# Patient Record
Sex: Male | Born: 1985 | Hispanic: Yes | Marital: Single | Smoking: Never smoker
Health system: Southern US, Community
[De-identification: ages and names within clinical notes are randomized; demographics above are authoritative.]

## PROBLEM LIST (undated history)

## (undated) DIAGNOSIS — Z789 Other specified health status: Secondary | ICD-10-CM

## (undated) DIAGNOSIS — K859 Acute pancreatitis without necrosis or infection, unspecified: Secondary | ICD-10-CM

## (undated) HISTORY — PX: NO PAST SURGERIES: SHX2092

## (undated) HISTORY — DX: Acute pancreatitis without necrosis or infection, unspecified: K85.90

---

## 2015-06-30 ENCOUNTER — Inpatient Hospital Stay
Admission: EM | Admit: 2015-06-30 | Discharge: 2015-07-01 | DRG: 440 | Disposition: A | Discharge status: Home / Self Care | Payer: Self-pay | Attending: Internal Medicine | Admitting: Internal Medicine

## 2015-06-30 ENCOUNTER — Inpatient Hospital Stay: Payer: Self-pay

## 2015-06-30 ENCOUNTER — Encounter: Payer: Self-pay | Admitting: Emergency Medicine

## 2015-06-30 DIAGNOSIS — K859 Acute pancreatitis without necrosis or infection, unspecified: Secondary | ICD-10-CM | POA: Diagnosis present

## 2015-06-30 DIAGNOSIS — R109 Unspecified abdominal pain: Secondary | ICD-10-CM | POA: Diagnosis present

## 2015-06-30 HISTORY — DX: Other specified health status: Z78.9

## 2015-06-30 LAB — URINALYSIS COMPLETE WITH MICROSCOPIC (ARMC ONLY)
BACTERIA UA: NONE SEEN
Bilirubin Urine: NEGATIVE
GLUCOSE, UA: NEGATIVE mg/dL
HGB URINE DIPSTICK: NEGATIVE
Ketones, ur: NEGATIVE mg/dL
LEUKOCYTES UA: NEGATIVE
NITRITE: NEGATIVE
PROTEIN: NEGATIVE mg/dL
SPECIFIC GRAVITY, URINE: 1.029 (ref 1.005–1.030)
SQUAMOUS EPITHELIAL / LPF: NONE SEEN
pH: 6 (ref 5.0–8.0)

## 2015-06-30 LAB — COMPREHENSIVE METABOLIC PANEL
ALBUMIN: 4.3 g/dL (ref 3.5–5.0)
ALT: 19 U/L (ref 17–63)
ANION GAP: 4 — AB (ref 5–15)
AST: 22 U/L (ref 15–41)
Alkaline Phosphatase: 38 U/L (ref 38–126)
BUN: 18 mg/dL (ref 6–20)
CHLORIDE: 105 mmol/L (ref 101–111)
CO2: 26 mmol/L (ref 22–32)
Calcium: 8.8 mg/dL — ABNORMAL LOW (ref 8.9–10.3)
Creatinine, Ser: 0.71 mg/dL (ref 0.61–1.24)
GFR calc Af Amer: >60 mL/min (ref >60)
GFR calc non Af Amer: >60 mL/min (ref >60)
GLUCOSE: 115 mg/dL — AB (ref 65–99)
POTASSIUM: 3.7 mmol/L (ref 3.5–5.1)
SODIUM: 135 mmol/L (ref 135–145)
Total Bilirubin: 1.4 mg/dL — ABNORMAL HIGH (ref 0.3–1.2)
Total Protein: 7.2 g/dL (ref 6.5–8.1)

## 2015-06-30 LAB — CBC
HEMATOCRIT: 43.4 % (ref 40.0–52.0)
HEMOGLOBIN: 15.1 g/dL (ref 13.0–18.0)
MCH: 31.3 pg (ref 26.0–34.0)
MCHC: 34.8 g/dL (ref 32.0–36.0)
MCV: 89.9 fL (ref 80.0–100.0)
Platelets: 197 10*3/uL (ref 150–440)
RBC: 4.82 MIL/uL (ref 4.40–5.90)
RDW: 12.8 % (ref 11.5–14.5)
WBC: 10.8 10*3/uL — ABNORMAL HIGH (ref 3.8–10.6)

## 2015-06-30 LAB — LIPASE, BLOOD: LIPASE: 363 U/L — AB (ref 22–51)

## 2015-06-30 MED ORDER — ONDANSETRON HCL 4 MG/2ML IJ SOLN
4.0000 mg | Freq: Once | INTRAMUSCULAR | Status: AC
  Administered 2015-06-30: 4 mg via INTRAVENOUS
  Filled 2015-06-30: qty 2

## 2015-06-30 MED ORDER — SODIUM CHLORIDE 0.9 % IV SOLN
INTRAVENOUS | Status: AC
  Administered 2015-06-30 – 2015-07-01 (×3): via INTRAVENOUS

## 2015-06-30 MED ORDER — SODIUM CHLORIDE 0.9 % IV BOLUS (SEPSIS)
1000.0000 mL | Freq: Once | INTRAVENOUS | Status: AC
  Administered 2015-06-30: 1000 mL via INTRAVENOUS

## 2015-06-30 MED ORDER — IOHEXOL 240 MG/ML SOLN
25.0000 mL | Freq: Once | INTRAMUSCULAR | Status: AC | PRN
  Administered 2015-06-30: 25 mL via ORAL

## 2015-06-30 MED ORDER — ONDANSETRON HCL 4 MG PO TABS
4.0000 mg | ORAL_TABLET | Freq: Four times a day (QID) | ORAL | Status: DC | PRN
Start: 2015-06-30 — End: 2015-07-01

## 2015-06-30 MED ORDER — ENOXAPARIN SODIUM 40 MG/0.4ML ~~LOC~~ SOLN
40.0000 mg | SUBCUTANEOUS | Status: DC
  Administered 2015-06-30 – 2015-07-01 (×2): 40 mg via SUBCUTANEOUS
  Filled 2015-06-30 (×2): qty 0.4

## 2015-06-30 MED ORDER — ONDANSETRON HCL 4 MG/2ML IJ SOLN: 4.0000 mg | Freq: Four times a day (QID) | INTRAMUSCULAR | Status: DC | PRN

## 2015-06-30 MED ORDER — MORPHINE SULFATE (PF) 4 MG/ML IV SOLN
4.0000 mg | INTRAVENOUS | Status: DC | PRN
  Administered 2015-06-30: 4 mg via INTRAVENOUS
  Filled 2015-06-30: qty 1

## 2015-06-30 MED ORDER — MORPHINE SULFATE (PF) 4 MG/ML IV SOLN
4.0000 mg | Freq: Once | INTRAVENOUS | Status: AC
  Administered 2015-06-30: 4 mg via INTRAVENOUS
  Filled 2015-06-30: qty 1

## 2015-06-30 NOTE — Progress Notes (Signed)
Initial Nutrition Assessment    INTERVENTION:   Meals and Snacks: diet progression as medically able   NUTRITION DIAGNOSIS:   Inadequate oral intake related to acute illness as evidenced by  (NPO/CL).  GOAL:   Patient will meet greater than or equal to 90% of their needs  MONITOR:    (Energy Intake, Anthropometrics, Digestive System)  REASON FOR ASSESSMENT:   Diagnosis     ASSESSMENT:    Pt admitted with acute pancreatitis, unclear etiology, workup in progress  Past Medical History  Diagnosis Date  . Patient denies medical problems      Diet Order:  Diet clear liquid Room service appropriate?: Yes; Fluid consistency:: Thin   Energy Intake: pt just admitted, diet just advanced  Food and Nutrition related history: appetite good prior to admission Electrolyte and Renal Profile:  Recent Labs Lab 06/30/15 0447  BUN 18  CREATININE 0.71  NA 135  K 3.7    Lipid Profile: no labs  Gastrointestinal Profile Lipase     Component Value Date/Time   LIPASE 363* 06/30/2015 0447    Meds: NS at 100 ml/hr  Height:   Ht Readings from Last 1 Encounters:  06/30/15  (1.676 m)    Weight: stable weight  Wt Readings from Last 1 Encounters:  06/30/15 137 lb 4.8 oz (62.279 kg)    BMI:  Body mass index is 22.17 kg/(m^2).  LOW Care Level  Romelle Starcher MS, [place], LDN [phone] Pager

## 2015-06-30 NOTE — ED Notes (Signed)
Pharmacy tech in room at this time. Spoke with Victorino Dike [phone]) on language line to discuss with patient admission to the floor. NAD noted at this time. Pt verbalized understanding via interpreter.

## 2015-06-30 NOTE — ED Notes (Signed)
Pt. States abdominal pain that started around 4 pm yesterday.  Pt. States pain has been intermittent.  Pt. Denies taking any OTC medications.  Pt. Denies same symptoms in the past.

## 2015-06-30 NOTE — Progress Notes (Signed)
Seen the pt, pain is little better- start on liquid diet.

## 2015-06-30 NOTE — ED Notes (Addendum)
Pt states had mid abd pain that started at 1600 yesterday. Pt states pain is worse if he takes a deep breath, moves. Pt denies nausea, vomiting, diarrhea. Pt states pain started approx 20 minutes after eating yesterday. Skin warm and dry, normal color. Information obtained via interpreter Rafeal.

## 2015-06-30 NOTE — H&P (Signed)
[facility] - [place] at [facility]   PATIENT NAME: Larry Farmer    MR#:  [id number]  DATE OF BIRTH:  07-23-1986  DATE OF ADMISSION:  06/30/2015  PRIMARY CARE PHYSICIAN: No PCP Per Patient   REQUESTING/REFERRING PHYSICIAN: Lenard Lance, MD  CHIEF COMPLAINT:   Chief Complaint  Patient presents with  . Abdominal Pain    HISTORY OF PRESENT ILLNESS:  Larry Farmer  is a 29 y.o. male who presents with acute epigastric abdominal pain. Patient has no prior known medical history, and takes no medications. He states that the pain began the day prior to admission around 4:00 the afternoon when he was drinking a couple of beers. Patient denies excessive alcohol use, stating that he only drinks one or 2 beers occasionally some weekends. He says that the pain was first epigastric, then became diffuse abdominal pain. Nothing seemed to make it better or worse, and a grandson severity until he decided to come to the ED for evaluation. He denies fever, chills. He endorses nausea without overt vomiting. He denies diarrhea. On evaluation in the ED was found to have an elevated bilirubin and elevated lipase. Hospitalists were called for admission for pancreatitis.  PAST MEDICAL HISTORY:   Past Medical History  Diagnosis Date  . Patient denies medical problems     PAST SURGICAL HISTORY:   Past Surgical History  Procedure Laterality Date  . No past surgeries      SOCIAL HISTORY:   Social History  Substance Use Topics  . Smoking status: Never Smoker   . Smokeless tobacco: Never Used  . Alcohol Use: 1.2 oz/week    2 Cans of beer per week    FAMILY HISTORY:   Family History  Problem Relation Age of Onset  . Family history unknown: Yes    DRUG ALLERGIES:  No Known Allergies  MEDICATIONS AT HOME:   Prior to Admission medications   Not on File    REVIEW OF SYSTEMS:  Review of Systems  Constitutional: Negative for fever, chills, weight loss and  malaise/fatigue.  HENT: Negative for ear pain, hearing loss and tinnitus.   Eyes: Negative for blurred vision, double vision, pain and redness.  Respiratory: Negative for cough, hemoptysis and shortness of breath.   Cardiovascular: Negative for chest pain, palpitations, orthopnea and leg swelling.  Gastrointestinal: Positive for nausea and abdominal pain. Negative for diarrhea and constipation.  Genitourinary: Negative for dysuria, frequency and hematuria.  Musculoskeletal: Negative for back pain, joint pain and neck pain.  Skin:       No acne, rash, or lesions  Neurological: Negative for dizziness, tremors, focal weakness and weakness.  Endo/Heme/Allergies: Negative for polydipsia. Does not bruise/bleed easily.  Psychiatric/Behavioral: Negative for depression. The patient is not nervous/anxious and does not have insomnia.      VITAL SIGNS:   Filed Vitals:   06/30/15 0452 06/30/15 0509  BP: 157/67 147/86  Pulse: 82 75  Temp: 98.5 F (36.9 C) 98.3 F (36.8 C)  TempSrc: Oral Oral  Resp: 16   Weight: 62.143 kg (137 lb)   SpO2: 100% 100%   Wt Readings from Last 3 Encounters:  06/30/15 62.143 kg (137 lb)    PHYSICAL EXAMINATION:  Physical Exam  Vitals reviewed. Constitutional: He is oriented to person, place, and time. He appears well-developed and well-nourished. No distress.  HENT:  Head: Normocephalic and atraumatic.  Mouth/Throat: Oropharynx is clear and moist.  Eyes: Conjunctivae and EOM are normal. Pupils are equal, round, and  reactive to light. Scleral icterus (very mild) is present.  Neck: Normal range of motion. Neck supple. No JVD present. No thyromegaly present.  Cardiovascular: Normal rate, regular rhythm and intact distal pulses.  Exam reveals no gallop and no friction rub.   No murmur heard. Respiratory: Effort normal and breath sounds normal. No respiratory distress. He has no wheezes. He has no rales.  GI: Soft. Bowel sounds are normal. He exhibits no  distension. There is tenderness (Left upper and lower quadrants).  Musculoskeletal: Normal range of motion. He exhibits no edema.  No arthritis, no gout  Lymphadenopathy:    He has no cervical adenopathy.  Neurological: He is alert and oriented to person, place, and time. No cranial nerve deficit.  No dysarthria, no aphasia  Skin: Skin is warm and dry. No rash noted. No erythema.  Psychiatric: He has a normal mood and affect. His behavior is normal. Judgment and thought content normal.    LABORATORY PANEL:   CBC  Recent Labs Lab 06/30/15 0447  WBC 10.8*  HGB 15.1  HCT 43.4  PLT 197   ------------------------------------------------------------------------------------------------------------------  Chemistries   Recent Labs Lab 06/30/15 0447  NA 135  K 3.7  CL 105  CO2 26  GLUCOSE 115*  BUN 18  CREATININE 0.71  CALCIUM 8.8*  AST 22  ALT 19  ALKPHOS 38  BILITOT 1.4*   ------------------------------------------------------------------------------------------------------------------  Cardiac Enzymes No results for input(s): TROPONINI in the last 168 hours. ------------------------------------------------------------------------------------------------------------------  RADIOLOGY:  No results found.  EKG:  No orders found for this or any previous visit.  IMPRESSION AND PLAN:  Principal Problem:   Pancreatitis - elevated lipase. Patient has some history of alcohol use, though he denies excessive use. We will keep him nothing by mouth for now, give him IV fluids, control his pain and nausea. He can begin eating again as tolerated once his pain is better controlled. Active Problems:   Abdominal pain - not classic for pancreatitis as it became diffuse, now seems to move to left upper and lower quadrants. We will get an ultrasound of the abdomen to evaluate for other potential pathologies.   Hyperbilirubinemia - somewhat elevated, potentially due to pancreatitis,  potentially due to some other pathology in his biliary tract, ultrasound abdomen as above   All the records are reviewed and case discussed with ED provider. Management plans discussed with the patient and/or family.  DVT PROPHYLAXIS: SubQ lovenox  ADMISSION STATUS: Inpatient  CODE STATUS: Full  TOTAL TIME TAKING CARE OF THIS PATIENT: 50 minutes.    Denzil Mceachron, Rohith FIELDING 06/30/2015, 7:13 AM  [facility]  Office  [phone]  CC: Primary care physician; No PCP Per Patient

## 2015-06-30 NOTE — ED Provider Notes (Signed)
[facility] Emergency Department Provider Note  Time seen: 5:19 AM  I have reviewed the triage vital signs and the nursing notes.   HISTORY  Chief Complaint Abdominal Pain    HPI Larry Farmer is a 29 y.o. male with no past medical history presents the emergency department for mid to left-sided abdominal pain. According to the patient around 4 PM yesterday he began experiencing mid abdominal pain which is migrated to the left side. States moderate pain much worse with movement especially bending movement. Denies any dysuria, hematuria, diarrhea, nausea, vomiting, fever. Describes his pain as moderate when lying flat, severe when moving.     History reviewed. No pertinent past medical history.  There are no active problems to display for this patient.   History reviewed. No pertinent past surgical history.  No current outpatient prescriptions on file.  Allergies Review of patient's allergies indicates no known allergies.  History reviewed. No pertinent family history.  Social History Social History  Substance Use Topics  . Smoking status: Never Smoker   . Smokeless tobacco: Never Used  . Alcohol Use: Yes    Review of Systems Constitutional: Negative for fever. Cardiovascular: Negative for chest pain. Respiratory: Negative for shortness of breath. Gastrointestinal: Positive for left-sided abdominal pain. Negative nausea, vomiting, diarrhea. Musculoskeletal: Negative for back pain. Neurological: Negative for headache 10-point ROS otherwise negative.  ____________________________________________   PHYSICAL EXAM:  VITAL SIGNS: ED Triage Vitals  Enc Vitals Group     BP 06/30/15 0452 157/67 mmHg     Pulse Rate 06/30/15 0452 82     Resp 06/30/15 0452 16     Temp 06/30/15 0452 98.5 F (36.9 C)     Temp Source 06/30/15 0452 Oral     SpO2 06/30/15 0452 100 %     Weight 06/30/15 0452 137 lb (62.143 kg)     Height --      Head Cir  --      Peak Flow --      Pain Score 06/30/15 0454 7     Pain Loc --      Pain Edu? --      Excl. in GC? --     Constitutional: Alert and oriented. Well appearing and in no distress. Eyes: Normal exam ENT   Head: Normocephalic and atraumatic. Cardiovascular: Normal rate, regular rhythm. No murmur Respiratory: Normal respiratory effort without tachypnea nor retractions. Breath sounds are clear and equal bilaterally. No wheezes/rales/rhonchi. Gastrointestinal: Soft, moderate left sided and mid abdominal tenderness palpation. No rebound or guarding. No distention. No CVA tenderness. Musculoskeletal: Nontender with normal range of motion in all extremities. Neurologic:  Normal speech and language. No gross focal neurologic deficits  Skin:  Skin is warm, dry and intact.  Psychiatric: Mood and affect are normal. Speech and behavior are normal. Patient exhibits appropriate insight and judgment.  ____________________________________________    INITIAL IMPRESSION / ASSESSMENT AND PLAN / ED COURSE  Pertinent labs & imaging results that were available during my care of the patient were reviewed by me and considered in my medical decision making (see chart for details).  Patient with moderate tenderness palpation of the mid left abdomen. We will check labs, and likely proceed with CT scan of the abdomen and pelvis given the degree of tenderness.  ----------------------------------------- 6:31 AM on 06/30/2015 -----------------------------------------  Labs are consistent with acute pancreatitis. Given this finding I do not believe the patient would benefit additionally from CT scan. Patient does drink alcohol. LFTs otherwise  within normal limits. We will start IV fluids, pain medication as needed, admit to the hospital for further treatment and evaluation.  ____________________________________________   FINAL CLINICAL IMPRESSION(S) / ED DIAGNOSES  Left-sided abdominal pain Acute  pancreatitis  Minna Antis, MD 06/30/15 [phone]

## 2015-07-01 LAB — CBC
HCT: 39.1 % — ABNORMAL LOW (ref 40.0–52.0)
Hemoglobin: 13.7 g/dL (ref 13.0–18.0)
MCH: 31.2 pg (ref 26.0–34.0)
MCHC: 34.9 g/dL (ref 32.0–36.0)
MCV: 89.2 fL (ref 80.0–100.0)
Platelets: 164 10*3/uL (ref 150–440)
RBC: 4.38 MIL/uL — ABNORMAL LOW (ref 4.40–5.90)
RDW: 13.1 % (ref 11.5–14.5)
WBC: 7.1 10*3/uL (ref 3.8–10.6)

## 2015-07-01 LAB — HEPATIC FUNCTION PANEL
ALK PHOS: 33 U/L — AB (ref 38–126)
ALT: 15 U/L — AB (ref 17–63)
AST: 16 U/L (ref 15–41)
Albumin: 3.6 g/dL (ref 3.5–5.0)
BILIRUBIN INDIRECT: 1.4 mg/dL — AB (ref 0.3–0.9)
BILIRUBIN TOTAL: 1.6 mg/dL — AB (ref 0.3–1.2)
Bilirubin, Direct: 0.2 mg/dL (ref 0.1–0.5)
Total Protein: 6.3 g/dL — ABNORMAL LOW (ref 6.5–8.1)

## 2015-07-01 LAB — BASIC METABOLIC PANEL
Anion gap: 5 (ref 5–15)
BUN: 8 mg/dL (ref 6–20)
CO2: 27 mmol/L (ref 22–32)
Calcium: 8.7 mg/dL — ABNORMAL LOW (ref 8.9–10.3)
Chloride: 107 mmol/L (ref 101–111)
Creatinine, Ser: 0.68 mg/dL (ref 0.61–1.24)
GFR calc Af Amer: >60 mL/min (ref >60)
GFR calc non Af Amer: >60 mL/min (ref >60)
Glucose, Bld: 98 mg/dL (ref 65–99)
Potassium: 3.4 mmol/L — ABNORMAL LOW (ref 3.5–5.1)
Sodium: 139 mmol/L (ref 135–145)

## 2015-07-01 MED ORDER — IBUPROFEN 200 MG PO TABS: 200.0000 mg | ORAL_TABLET | Freq: Four times a day (QID) | ORAL | Status: AC | PRN

## 2015-07-01 NOTE — Discharge Instructions (Signed)

## 2015-07-01 NOTE — Progress Notes (Signed)
Larry Farmer and O x 4. VSS. Larry tolerating diet well. Minimal complaints of pain with no meds needed. No complaints of nausea. IV removed intact, prescriptions given. Larry voiced understanding of discharge instructions with no further questions. Interpreter used to go over discharge instructions. Larry discharged via wheelchair with nurse.

## 2015-07-01 NOTE — Discharge Summary (Signed)
[facility] - [place] at [facility]   PATIENT NAME: Larry Farmer    MR#:  [id number]  DATE OF BIRTH:  Mar 22, 1986  DATE OF ADMISSION:  06/30/2015 ADMITTING PHYSICIAN: Oralia Manis, MD  DATE OF DISCHARGE: 07/01/2015  PRIMARY CARE PHYSICIAN: No PCP Per Patient    ADMISSION DIAGNOSIS:  Acute pancreatitis, unspecified pancreatitis type [K85.9]  DISCHARGE DIAGNOSIS:  Principal Problem:   Pancreatitis Active Problems:   Hyperbilirubinemia   Abdominal pain   SECONDARY DIAGNOSIS:   Past Medical History  Diagnosis Date  . Patient denies medical problems     HOSPITAL COURSE:    * Acute Pancreatitis - elevated lipase.  some history of alcohol use, though he denies excessive use.  improved with IV fluids, not much pain now, tolerated soft diet.  Discharge home.   * Abdominal pain - not classic for pancreatitis as it became diffuse, now seems to move to left upper and lower quadrants.   US abdomen ruled out other potential pathologies. Pain is much better now.  * Hyperbilirubinemia - somewhat elevated, potentially due to pancreatitis,   No stone o obstruction on [place] abd- and LFTs remained stable on follow up today.  DISCHARGE CONDITIONS:   stable  CONSULTS OBTAINED:     DRUG ALLERGIES:  No Known Allergies  DISCHARGE MEDICATIONS:   Current Discharge Medication List    START taking these medications   Details  ibuprofen (ADVIL) 200 MG tablet Take 1 tablet (200 mg total) by mouth every 6 (six) hours as needed. Qty: 15 tablet, Refills: 0         DISCHARGE INSTRUCTIONS:    Follow with PMD as routine.  If you experience worsening of your admission symptoms, develop shortness of breath, life threatening emergency, suicidal or homicidal thoughts you must seek medical attention immediately by calling 911 or calling your MD immediately  if symptoms less severe.  You Must read complete instructions/literature along with  all the possible adverse reactions/side effects for all the Medicines you take and that have been prescribed to you. Take any new Medicines after you have completely understood and accept all the possible adverse reactions/side effects.   Please note  You were cared for by a hospitalist during your hospital stay. If you have any questions about your discharge medications or the care you received while you were in the hospital after you are discharged, you can call the unit and asked to speak with the hospitalist on call if the hospitalist that took care of you is not available. Once you are discharged, your primary care physician will handle any further medical issues. Please note that NO REFILLS for any discharge medications will be authorized once you are discharged, as it is imperative that you return to your primary care physician (or establish a relationship with a primary care physician if you do not have one) for your aftercare needs so that they can reassess your need for medications and monitor your lab values.    Today   CHIEF COMPLAINT:   Chief Complaint  Patient presents with  . Abdominal Pain    HISTORY OF PRESENT ILLNESS:  Larry Farmer  is a 29 y.o. male who presents with acute epigastric abdominal pain. Patient has no prior known medical history, and takes no medications. He states that the pain began the day prior to admission around 4:00 the afternoon when he was drinking a couple of beers. Patient denies excessive alcohol use, stating that he only drinks one  or 2 beers occasionally some weekends. He says that the pain was first epigastric, then became diffuse abdominal pain. Nothing seemed to make it better or worse, and a grandson severity until he decided to come to the ED for evaluation. He denies fever, chills. He endorses nausea without overt vomiting. He denies diarrhea. On evaluation in the ED was found to have an elevated bilirubin and elevated lipase. Hospitalists  were called for admission for pancreatitis.   VITAL SIGNS:  Blood pressure 132/60, pulse 65, temperature 98.5 F (36.9 C), temperature source Oral, resp. rate 16, height  (1.676 m), weight 62.279 kg (137 lb 4.8 oz), SpO2 100 %.  I/O:   Intake/Output Summary (Last 24 hours) at 07/01/15 1449 Last data filed at 07/01/15 1300  Gross per 24 hour  Intake   2664 ml  Output   2150 ml  Net    514 ml    PHYSICAL EXAMINATION:  GENERAL:  29 y.o.-year-old patient lying in the bed with no acute distress.  EYES: Pupils equal, round, reactive to light and accommodation. No scleral icterus. Extraocular muscles intact.  HEENT: Head atraumatic, normocephalic. Oropharynx and nasopharynx clear.  NECK:  Supple, no jugular venous distention. No thyroid enlargement, no tenderness.  LUNGS: Normal breath sounds bilaterally, no wheezing, rales,rhonchi or crepitation. No use of accessory muscles of respiration.  CARDIOVASCULAR: S1, S2 normal. No murmurs, rubs, or gallops.  ABDOMEN: Soft, mild tender, non-distended. Bowel sounds present. No organomegaly or mass.  EXTREMITIES: No pedal edema, cyanosis, or clubbing.  NEUROLOGIC: Cranial nerves II through XII are intact. Muscle strength 5/5 in all extremities. Sensation intact. Gait not checked.  PSYCHIATRIC: The patient is alert and oriented x 3.  SKIN: No obvious rash, lesion, or ulcer.   DATA REVIEW:   CBC  Recent Labs Lab 07/01/15 0614  WBC 7.1  HGB 13.7  HCT 39.1*  PLT 164    Chemistries   Recent Labs Lab 07/01/15 0614  NA 139  K 3.4*  CL 107  CO2 27  GLUCOSE 98  BUN 8  CREATININE 0.68  CALCIUM 8.7*  AST 16  ALT 15*  ALKPHOS 33*  BILITOT 1.6*    Cardiac Enzymes No results for input(s): TROPONINI in the last 168 hours.  Microbiology Results  No results found for this or any previous visit.  RADIOLOGY:  US Abdomen Complete  06/30/2015   CLINICAL DATA:  Diffuse abdominal pain. Elevated bilirubin and lipase.  EXAM:  ULTRASOUND ABDOMEN COMPLETE  COMPARISON:  None.  FINDINGS: Gallbladder: Gallbladder has a normal appearance. Gallbladder wall is 2.0 mm, within normal limits. No stones or pericholecystic fluid. No sonographic Murphy's sign.  Common bile duct: Diameter: 1.8 mm  Liver: No focal lesion identified. Liver parenchyma appears mildly hypoechoic with prominent portal triads. This finding can be seen in diffuse hepatic edema and hepatitis.  IVC: No abnormality visualized.  Pancreas: Visualized portion unremarkable.  Spleen: Size and appearance within normal limits.  Right Kidney: Length: 11.1 cm. Normal renal echogenicity. No focal mass or hydronephrosis.  Left Kidney: Length: 11.3 cm. Normal renal echogenicity. No focal mass or hydronephrosis.  Abdominal aorta: No aneurysm visualized.  Other findings: None.  IMPRESSION: 1. Hypoechoic liver, a finding correlated with hepatitis. 2. No focal hepatic mass or evidence for biliary obstruction. 3. Normal appearance of the kidneys.   Electronically Signed   By: Norva Pavlov M.D.   On: 06/30/2015 13:41    EKG:  No orders found for this or any previous visit.  Management plans discussed with the patient, family and they are in agreement.  CODE STATUS:     Code Status Orders        Start     Ordered   06/30/15 0928  Full code   Continuous     06/30/15 0927      TOTAL TIME TAKING CARE OF THIS PATIENT:35  minutes.    Altamese Dilling M.D on 07/01/2015 at 2:49 PM  Between 7am to 6pm - Pager - [phone]  After 6pm go to [email or web address] - password [email or web address] [facility]  [facility]  Office  [phone]  CC: Primary care physician; No PCP Per Patient

## 2015-07-01 NOTE — Progress Notes (Signed)
[facility] - [place] at [facility]   PATIENT NAME: Larry Farmer    MR#:  [id number]  DATE OF BIRTH:  1986/01/14  SUBJECTIVE:  CHIEF COMPLAINT:   Chief Complaint  Patient presents with  . Abdominal Pain   very less pain today- and tolerated soft diet.  REVIEW OF SYSTEMS:  CONSTITUTIONAL: No fever, fatigue or weakness.  EYES: No blurred or double vision.  EARS, NOSE, AND THROAT: No tinnitus or ear pain.  RESPIRATORY: No cough, shortness of breath, wheezing or hemoptysis.  CARDIOVASCULAR: No chest pain, orthopnea, edema.  GASTROINTESTINAL: No nausea, vomiting, diarrhea , have mild abdominal pain.  GENITOURINARY: No dysuria, hematuria.  ENDOCRINE: No polyuria, nocturia,  HEMATOLOGY: No anemia, easy bruising or bleeding SKIN: No rash or lesion. MUSCULOSKELETAL: No joint pain or arthritis.   NEUROLOGIC: No tingling, numbness, weakness.  PSYCHIATRY: No anxiety or depression.   ROS  DRUG ALLERGIES:  No Known Allergies  VITALS:  Blood pressure 132/60, pulse 65, temperature 98.5 F (36.9 C), temperature source Oral, resp. rate 16, height  (1.676 m), weight 62.279 kg (137 lb 4.8 oz), SpO2 100 %.  PHYSICAL EXAMINATION:  GENERAL:  29 y.o.-year-old patient lying in the bed with no acute distress.  EYES: Pupils equal, round, reactive to light and accommodation. No scleral icterus. Extraocular muscles intact.  HEENT: Head atraumatic, normocephalic. Oropharynx and nasopharynx clear.  NECK:  Supple, no jugular venous distention. No thyroid enlargement, no tenderness.  LUNGS: Normal breath sounds bilaterally, no wheezing, rales,rhonchi or crepitation. No use of accessory muscles of respiration.  CARDIOVASCULAR: S1, S2 normal. No murmurs, rubs, or gallops.  ABDOMEN: Soft, mild tender, nondistended. Bowel sounds present. No organomegaly or mass.  EXTREMITIES: No pedal edema, cyanosis, or clubbing.  NEUROLOGIC: Cranial nerves II through XII are intact.  Muscle strength 5/5 in all extremities. Sensation intact. Gait not checked.  PSYCHIATRIC: The patient is alert and oriented x 3.  SKIN: No obvious rash, lesion, or ulcer.   Physical Exam LABORATORY PANEL:   CBC  Recent Labs Lab 07/01/15 0614  WBC 7.1  HGB 13.7  HCT 39.1*  PLT 164   ------------------------------------------------------------------------------------------------------------------  Chemistries   Recent Labs Lab 07/01/15 0614  NA 139  K 3.4*  CL 107  CO2 27  GLUCOSE 98  BUN 8  CREATININE 0.68  CALCIUM 8.7*  AST 16  ALT 15*  ALKPHOS 33*  BILITOT 1.6*   ------------------------------------------------------------------------------------------------------------------  Cardiac Enzymes No results for input(s): TROPONINI in the last 168 hours. ------------------------------------------------------------------------------------------------------------------  RADIOLOGY:  US Abdomen Complete  06/30/2015   CLINICAL DATA:  Diffuse abdominal pain. Elevated bilirubin and lipase.  EXAM: ULTRASOUND ABDOMEN COMPLETE  COMPARISON:  None.  FINDINGS: Gallbladder: Gallbladder has a normal appearance. Gallbladder wall is 2.0 mm, within normal limits. No stones or pericholecystic fluid. No sonographic Murphy's sign.  Common bile duct: Diameter: 1.8 mm  Liver: No focal lesion identified. Liver parenchyma appears mildly hypoechoic with prominent portal triads. This finding can be seen in diffuse hepatic edema and hepatitis.  IVC: No abnormality visualized.  Pancreas: Visualized portion unremarkable.  Spleen: Size and appearance within normal limits.  Right Kidney: Length: 11.1 cm. Normal renal echogenicity. No focal mass or hydronephrosis.  Left Kidney: Length: 11.3 cm. Normal renal echogenicity. No focal mass or hydronephrosis.  Abdominal aorta: No aneurysm visualized.  Other findings: None.  IMPRESSION: 1. Hypoechoic liver, a finding correlated with hepatitis. 2. No focal hepatic  mass or evidence for biliary obstruction. 3. Normal appearance of  the kidneys.   Electronically Signed   By: Norva Pavlov M.D.   On: 06/30/2015 13:41    ASSESSMENT AND PLAN:   * Acute Pancreatitis - elevated lipase.   some history of alcohol use, though he denies excessive use.   improved with IV fluids, not much pain now, tolerated soft diet.   Discharge home.    * Abdominal pain - not classic for pancreatitis as it became diffuse, now seems to move to left upper and lower quadrants.    US abdomen ruled out other potential pathologies.  Pain is much better now.  * Hyperbilirubinemia - somewhat elevated, potentially due to pancreatitis,    No stone o obstruction on [place] abd- and LFTs remained stable on follow up today.  All the records are reviewed and case discussed with Care Management/Social Workerr. Management plans discussed with the patient, family and they are in agreement.  CODE STATUS: full  TOTAL TIME TAKING CARE OF THIS PATIENT: 35 minutes.     D/c today.   Altamese Dilling M.D on 07/01/2015   Between 7am to 6pm - Pager - [phone]  After 6pm go to [email or web address] - password [email or web address] [facility]  [place] [facility]  Office  [phone]  CC: Primary care physician; No PCP Per Patient

## 2015-08-18 ENCOUNTER — Inpatient Hospital Stay
Admission: EM | Admit: 2015-08-18 | Discharge: 2015-08-21 | DRG: 440 | Disposition: A | Discharge status: Home / Self Care | Payer: Self-pay | Attending: Internal Medicine | Admitting: Internal Medicine

## 2015-08-18 ENCOUNTER — Other Ambulatory Visit: Payer: Self-pay

## 2015-08-18 ENCOUNTER — Encounter: Payer: Self-pay | Admitting: Emergency Medicine

## 2015-08-18 DIAGNOSIS — R1013 Epigastric pain: Secondary | ICD-10-CM

## 2015-08-18 DIAGNOSIS — R112 Nausea with vomiting, unspecified: Secondary | ICD-10-CM

## 2015-08-18 DIAGNOSIS — K852 Alcohol induced acute pancreatitis without necrosis or infection: Principal | ICD-10-CM | POA: Diagnosis present

## 2015-08-18 DIAGNOSIS — K859 Acute pancreatitis without necrosis or infection, unspecified: Secondary | ICD-10-CM | POA: Diagnosis present

## 2015-08-18 DIAGNOSIS — F1010 Alcohol abuse, uncomplicated: Secondary | ICD-10-CM | POA: Diagnosis present

## 2015-08-18 DIAGNOSIS — E876 Hypokalemia: Secondary | ICD-10-CM | POA: Diagnosis present

## 2015-08-18 LAB — CBC
HCT: 45.8 % (ref 40.0–52.0)
HEMOGLOBIN: 15.6 g/dL (ref 13.0–18.0)
MCH: 30.4 pg (ref 26.0–34.0)
MCHC: 34.1 g/dL (ref 32.0–36.0)
MCV: 88.9 fL (ref 80.0–100.0)
Platelets: 214 10*3/uL (ref 150–440)
RBC: 5.15 MIL/uL (ref 4.40–5.90)
RDW: 12.8 % (ref 11.5–14.5)
WBC: 15.7 10*3/uL — AB (ref 3.8–10.6)

## 2015-08-18 LAB — COMPREHENSIVE METABOLIC PANEL
ALBUMIN: 4.5 g/dL (ref 3.5–5.0)
ALK PHOS: 37 U/L — AB (ref 38–126)
ALT: 23 U/L (ref 17–63)
ANION GAP: 9 (ref 5–15)
AST: 28 U/L (ref 15–41)
BILIRUBIN TOTAL: 1.2 mg/dL (ref 0.3–1.2)
BUN: 17 mg/dL (ref 6–20)
CALCIUM: 9.3 mg/dL (ref 8.9–10.3)
CO2: 27 mmol/L (ref 22–32)
Chloride: 104 mmol/L (ref 101–111)
Creatinine, Ser: 0.88 mg/dL (ref 0.61–1.24)
GFR calc Af Amer: >60 mL/min (ref >60)
GLUCOSE: 138 mg/dL — AB (ref 65–99)
POTASSIUM: 3.2 mmol/L — AB (ref 3.5–5.1)
Sodium: 140 mmol/L (ref 135–145)
TOTAL PROTEIN: 7.5 g/dL (ref 6.5–8.1)

## 2015-08-18 MED ORDER — HYDROMORPHONE HCL 1 MG/ML IJ SOLN
1.0000 mg | Freq: Once | INTRAMUSCULAR | Status: AC
  Administered 2015-08-18: 1 mg via INTRAVENOUS
  Filled 2015-08-18: qty 1

## 2015-08-18 MED ORDER — ONDANSETRON HCL 4 MG/2ML IJ SOLN
INTRAMUSCULAR | Status: AC
  Filled 2015-08-18: qty 2

## 2015-08-18 MED ORDER — ONDANSETRON HCL 4 MG/2ML IJ SOLN
4.0000 mg | Freq: Once | INTRAMUSCULAR | Status: AC | PRN
  Administered 2015-08-18: 4 mg via INTRAVENOUS

## 2015-08-18 NOTE — ED Notes (Signed)
Care assumed. Pt resting in bed without distress. 

## 2015-08-18 NOTE — ED Provider Notes (Signed)
[facility] Emergency Department Provider Note  ____________________________________________  Time seen: Approximately 11:46 PM  I have reviewed the triage vital signs and the nursing notes.   HISTORY  Chief Complaint Abdominal Pain and Emesis  History obtained via Spanish interpreter  HPI Larry Farmer is a 29 y.o. male who presents to the ED with a chief complaint of abdominal pain and vomiting. Patient has a history of alcohol-induced pancreatitis and states this feels similar. Symptoms began earlier this afternoon after 4 beers. Patient complains of epigastric and left upper quadrant pain associated with nausea and vomiting 5. Denies fever, chills, chest pain, shortness of breath, diarrhea. Nothing makes the pain better or worse.Denies recent travel or trauma.   Past Medical History  Diagnosis Date  . Patient denies medical problems     Patient Active Problem List   Diagnosis Date Noted  . Pancreatitis 06/30/2015  . Hyperbilirubinemia 06/30/2015  . Abdominal pain 06/30/2015    Past Surgical History  Procedure Laterality Date  . No past surgeries      Current Outpatient Rx  Name  Route  Sig  Dispense  Refill  . ibuprofen (ADVIL) 200 MG tablet   Oral   Take 1 tablet (200 mg total) by mouth every 6 (six) hours as needed.   15 tablet   0     Allergies Review of patient's allergies indicates no known allergies.  Family History  Problem Relation Age of Onset  . Family history unknown: Yes    Social History Social History  Substance Use Topics  . Smoking status: Never Smoker   . Smokeless tobacco: Never Used  . Alcohol Use: 2.4 oz/week    4 Cans of beer per week    Review of Systems Constitutional: No fever/chills Eyes: No visual changes. ENT: No sore throat. Cardiovascular: Denies chest pain. Respiratory: Denies shortness of breath. Gastrointestinal: Positive for abdominal pain. Positive for nausea and vomiting.  No  diarrhea.  No constipation. Genitourinary: Negative for dysuria. Musculoskeletal: Negative for back pain. Skin: Negative for rash. Neurological: Negative for headaches, focal weakness or numbness.  10-point ROS otherwise negative.  ____________________________________________   PHYSICAL EXAM:  VITAL SIGNS: ED Triage Vitals  Enc Vitals Group     BP 08/18/15 2129 123/77 mmHg     Pulse Rate 08/18/15 2129 70     Resp 08/18/15 2129 24     Temp 08/18/15 2129 97.6 F (36.4 C)     Temp Source 08/18/15 2129 Oral     SpO2 08/18/15 2129 100 %     Weight 08/18/15 2129 132 lb (59.875 kg)     Height 08/18/15 2129  (1.702 m)     Head Cir --      Peak Flow --      Pain Score 08/18/15 2130 9     Pain Loc --      Pain Edu? --      Excl. in GC? --     Constitutional: Alert and oriented. Well appearing and in mild acute distress. Eyes: Conjunctivae are normal. PERRL. EOMI. Head: Atraumatic. Nose: No congestion/rhinnorhea. Mouth/Throat: Mucous membranes are moist.  Oropharynx non-erythematous. Neck: No stridor.   Cardiovascular: Normal rate, regular rhythm. Grossly normal heart sounds.  Good peripheral circulation. Respiratory: Normal respiratory effort.  No retractions. Lungs CTAB. Gastrointestinal: Soft and moderately tender to palpation epigastrium and left upper quadrant without rebound or guarding.. No distention. No abdominal bruits. No CVA tenderness. Musculoskeletal: No lower extremity tenderness nor edema.  No joint effusions. Neurologic:  Normal speech and language. No gross focal neurologic deficits are appreciated. No gait instability. Skin:  Skin is warm, dry and intact. No rash noted. Psychiatric: Mood and affect are normal. Speech and behavior are normal.  ____________________________________________   LABS (all labs ordered are listed, but only abnormal results are displayed)  Labs Reviewed  LIPASE, BLOOD - Abnormal; Notable for the following:    Lipase 992 (*)     All other components within normal limits  COMPREHENSIVE METABOLIC PANEL - Abnormal; Notable for the following:    Potassium 3.2 (*)    Glucose, Bld 138 (*)    Alkaline Phosphatase 37 (*)    All other components within normal limits  CBC - Abnormal; Notable for the following:    WBC 15.7 (*)    All other components within normal limits  URINALYSIS COMPLETEWITH MICROSCOPIC ([facility] ONLY)   ____________________________________________  EKG  ED ECG REPORT I, Asma Boldon J, the attending physician, personally viewed and interpreted this ECG.   Date: 08/19/2015  EKG Time: 2137  Rate: 67  Rhythm: normal EKG, normal sinus rhythm  Axis: Normal  Intervals:right bundle branch block  ST&T Change: Nonspecific  ____________________________________________  RADIOLOGY  None ____________________________________________   PROCEDURES  Procedure(s) performed: None  Critical Care performed: No  ____________________________________________   INITIAL IMPRESSION / ASSESSMENT AND PLAN / ED COURSE  Pertinent labs & imaging results that were available during my care of the patient were reviewed by me and considered in my medical decision making (see chart for details).  29 year old male who presents with upper abdominal pain associated with vomiting; prior history of pancreatitis. Laboratory results notable for leukocytosis and elevated lipase consistent with acute pancreatitis. Will initiate IV fluid resuscitation, IV analgesia and antiemetic. Discussed with hospitalist who will evaluate patient in the emergency department for admission. ____________________________________________   FINAL CLINICAL IMPRESSION(S) / ED DIAGNOSES  Final diagnoses:  Acute pancreatitis, unspecified pancreatitis type  Epigastric pain  Non-intractable vomiting with nausea, vomiting of unspecified type      Irean HongJade J Kiowa Hollar, MD 08/19/15 [phone]

## 2015-08-18 NOTE — ED Notes (Signed)
Pt states has left upper quadrant pain and vomiting for 4 hours. Pt with sallow pale skin noted in triage and yellow sclera.

## 2015-08-19 ENCOUNTER — Encounter: Payer: Self-pay | Admitting: Internal Medicine

## 2015-08-19 LAB — LIPASE, BLOOD
Lipase: 992 U/L — ABNORMAL HIGH (ref 11–51)
Lipase: 380 U/L — ABNORMAL HIGH (ref 11–51)

## 2015-08-19 LAB — CBC WITH DIFFERENTIAL/PLATELET
Basophils Absolute: 0.1 10*3/uL (ref 0–0.1)
Basophils Relative: 0 %
Eosinophils Absolute: 0 10*3/uL (ref 0–0.7)
Eosinophils Relative: 0 %
HEMATOCRIT: 41.9 % (ref 40.0–52.0)
HEMOGLOBIN: 14.4 g/dL (ref 13.0–18.0)
LYMPHS ABS: 0.6 10*3/uL — AB (ref 1.0–3.6)
Lymphocytes Relative: 4 %
MCH: 30.4 pg (ref 26.0–34.0)
MCHC: 34.2 g/dL (ref 32.0–36.0)
MCV: 88.7 fL (ref 80.0–100.0)
MONO ABS: 0.6 10*3/uL (ref 0.2–1.0)
MONOS PCT: 4 %
NEUTROS ABS: 12.9 10*3/uL — AB (ref 1.4–6.5)
NEUTROS PCT: 92 %
Platelets: 191 10*3/uL (ref 150–440)
RBC: 4.73 MIL/uL (ref 4.40–5.90)
RDW: 12.6 % (ref 11.5–14.5)
WBC: 14.1 10*3/uL — ABNORMAL HIGH (ref 3.8–10.6)

## 2015-08-19 LAB — URINALYSIS COMPLETE WITH MICROSCOPIC (ARMC ONLY)
Bacteria, UA: NONE SEEN
Bilirubin Urine: NEGATIVE
Glucose, UA: 50 mg/dL — AB
Hgb urine dipstick: NEGATIVE
Leukocytes, UA: NEGATIVE
Nitrite: NEGATIVE
Protein, ur: NEGATIVE mg/dL
Specific Gravity, Urine: 1.024 (ref 1.005–1.030)
Squamous Epithelial / LPF: NONE SEEN
pH: 6 (ref 5.0–8.0)

## 2015-08-19 LAB — HEMOGLOBIN A1C: Hgb A1c MFr Bld: 4.8 % (ref 4.0–6.0)

## 2015-08-19 MED ORDER — FOLIC ACID 1 MG PO TABS
1.0000 mg | ORAL_TABLET | Freq: Every day | ORAL | Status: DC
  Administered 2015-08-19 – 2015-08-21 (×3): 1 mg via ORAL
  Filled 2015-08-19 (×3): qty 1

## 2015-08-19 MED ORDER — THIAMINE HCL 100 MG/ML IJ SOLN: 100.0000 mg | Freq: Every day | INTRAMUSCULAR | Status: DC

## 2015-08-19 MED ORDER — HEPARIN SODIUM (PORCINE) 5000 UNIT/ML IJ SOLN
5000.0000 [IU] | Freq: Three times a day (TID) | INTRAMUSCULAR | Status: DC
  Administered 2015-08-19: 5000 [IU] via SUBCUTANEOUS
  Filled 2015-08-19: qty 1

## 2015-08-19 MED ORDER — POTASSIUM CHLORIDE IN NACL 40-0.9 MEQ/L-% IV SOLN
INTRAVENOUS | Status: DC
  Administered 2015-08-19 (×3): 125 mL/h via INTRAVENOUS
  Administered 2015-08-20 – 2015-08-21 (×2): 75 mL/h via INTRAVENOUS
  Filled 2015-08-19 (×9): qty 1000

## 2015-08-19 MED ORDER — MORPHINE SULFATE (PF) 4 MG/ML IV SOLN
4.0000 mg | INTRAVENOUS | Status: DC | PRN
  Administered 2015-08-19: 10:00:00 2 mg via INTRAVENOUS
  Administered 2015-08-19: 04:00:00 4 mg via INTRAVENOUS
  Filled 2015-08-19 (×2): qty 1

## 2015-08-19 MED ORDER — MORPHINE SULFATE (PF) 2 MG/ML IV SOLN: 2.0000 mg | INTRAVENOUS | Status: DC | PRN

## 2015-08-19 MED ORDER — ONDANSETRON HCL 4 MG PO TABS: 4.0000 mg | ORAL_TABLET | Freq: Four times a day (QID) | ORAL | Status: DC | PRN

## 2015-08-19 MED ORDER — SODIUM CHLORIDE 0.9 % IV BOLUS (SEPSIS)
1000.0000 mL | Freq: Once | INTRAVENOUS | Status: AC
  Administered 2015-08-19: 1000 mL via INTRAVENOUS
  Filled 2015-08-19: qty 1000

## 2015-08-19 MED ORDER — LORAZEPAM 2 MG/ML IJ SOLN: 0.0000 mg | Freq: Four times a day (QID) | INTRAMUSCULAR | Status: AC

## 2015-08-19 MED ORDER — MORPHINE SULFATE (PF) 4 MG/ML IV SOLN
4.0000 mg | Freq: Once | INTRAVENOUS | Status: AC
  Administered 2015-08-19: 4 mg via INTRAVENOUS
  Filled 2015-08-19: qty 1

## 2015-08-19 MED ORDER — SODIUM CHLORIDE 0.9 % IV BOLUS (SEPSIS)
1000.0000 mL | Freq: Once | INTRAVENOUS | Status: AC
  Administered 2015-08-19: 1000 mL via INTRAVENOUS

## 2015-08-19 MED ORDER — OXYCODONE-ACETAMINOPHEN 7.5-325 MG PO TABS
1.0000 | ORAL_TABLET | ORAL | Status: DC | PRN
  Administered 2015-08-19 – 2015-08-20 (×4): 1 via ORAL
  Filled 2015-08-19 (×4): qty 1

## 2015-08-19 MED ORDER — ADULT MULTIVITAMIN W/MINERALS CH
1.0000 | ORAL_TABLET | Freq: Every day | ORAL | Status: DC
  Administered 2015-08-19 – 2015-08-21 (×3): 1 via ORAL
  Filled 2015-08-19 (×3): qty 1

## 2015-08-19 MED ORDER — ENOXAPARIN SODIUM 40 MG/0.4ML ~~LOC~~ SOLN
40.0000 mg | SUBCUTANEOUS | Status: DC
  Administered 2015-08-19 – 2015-08-20 (×2): 40 mg via SUBCUTANEOUS
  Filled 2015-08-19 (×2): qty 0.4

## 2015-08-19 MED ORDER — LORAZEPAM 2 MG/ML IJ SOLN: 1.0000 mg | Freq: Four times a day (QID) | INTRAMUSCULAR | Status: DC | PRN

## 2015-08-19 MED ORDER — VITAMIN B-1 100 MG PO TABS
100.0000 mg | ORAL_TABLET | Freq: Every day | ORAL | Status: DC
  Administered 2015-08-19 – 2015-08-21 (×3): 100 mg via ORAL
  Filled 2015-08-19 (×3): qty 1

## 2015-08-19 MED ORDER — ONDANSETRON HCL 4 MG/2ML IJ SOLN
4.0000 mg | Freq: Once | INTRAMUSCULAR | Status: AC
  Administered 2015-08-19: 4 mg via INTRAVENOUS
  Filled 2015-08-19: qty 2

## 2015-08-19 MED ORDER — LORAZEPAM 2 MG/ML IJ SOLN: 0.0000 mg | Freq: Two times a day (BID) | INTRAMUSCULAR | Status: DC

## 2015-08-19 MED ORDER — ONDANSETRON HCL 4 MG/2ML IJ SOLN: 4.0000 mg | Freq: Four times a day (QID) | INTRAMUSCULAR | Status: DC | PRN

## 2015-08-19 MED ORDER — SODIUM CHLORIDE 0.9 % IJ SOLN: 3.0000 mL | Freq: Two times a day (BID) | INTRAMUSCULAR | Status: DC

## 2015-08-19 MED ORDER — LORAZEPAM 1 MG PO TABS: 1.0000 mg | ORAL_TABLET | Freq: Four times a day (QID) | ORAL | Status: DC | PRN

## 2015-08-19 NOTE — Plan of Care (Signed)
Problem: Discharge Progression Outcomes Goal: Other Discharge Outcomes/Goals Outcome: Progressing Pt admitted for pancreatitis r/t ETOH.  Pt here in Sept for same dx. Lipase level improved from 992 to 380.  Diet advanced to clear liquid and pt is tolerating well.  C/o abd pain.  Gave 2mg  morphine 1x and percocet 1x.  Had fluid bolus.  CIWA score improved from 4 to 2.  Interpreter helped w/ assessment and med pass.  VSS.  Lipase to be repeated in am.

## 2015-08-19 NOTE — Progress Notes (Signed)
[facility] Physicians - [place] at [facility]   PATIENT NAME: Thayne Cindric    MR#:  [id number]  DATE OF BIRTH:  07/14/1986  SUBJECTIVE:  CHIEF COMPLAINT:   Chief Complaint  Patient presents with  . Abdominal Pain  . Emesis   Patient admitted for abdominal pain and nausea. Found to have pancreatitis with lipase elevated at 900. No significant improvement in pain today. Afebrile No shortness of breath or dysuria or diarrhea  History obtained through interpreter. Plan was discussed and all questions answered. REVIEW OF SYSTEMS:    Review of Systems  Constitutional: Negative for fever, chills and weight loss.  HENT: Negative for hearing loss and nosebleeds.   Eyes: Negative for blurred vision, double vision and pain.  Respiratory: Negative for cough, hemoptysis, sputum production, shortness of breath and wheezing.   Cardiovascular: Negative for chest pain, palpitations, orthopnea and leg swelling.  Gastrointestinal: Positive for heartburn, nausea and abdominal pain. Negative for vomiting, diarrhea and constipation.  Genitourinary: Negative for dysuria and hematuria.  Musculoskeletal: Negative for myalgias, back pain and falls.  Skin: Negative for rash.  Neurological: Positive for weakness. Negative for dizziness, tremors, sensory change, speech change, focal weakness, seizures and headaches.  Endo/Heme/Allergies: Does not bruise/bleed easily.  Psychiatric/Behavioral: Negative for depression and memory loss. The patient is not nervous/anxious.       DRUG ALLERGIES:  No Known Allergies  VITALS:  Blood pressure 141/84, pulse 74, temperature 98.1 F (36.7 C), temperature source Oral, resp. rate 18, height  (1.702 m), weight 61.281 kg (135 lb 1.6 oz), SpO2 100 %.  PHYSICAL EXAMINATION:   Physical Exam  GENERAL:  29 y.o.-year-old patient lying in the bed with no acute distress.  EYES: Pupils equal, round, reactive to light and accommodation. No  scleral icterus. Extraocular muscles intact.  HEENT: Head atraumatic, normocephalic. Oropharynx and nasopharynx clear.  NECK:  Supple, no jugular venous distention. No thyroid enlargement, no tenderness.  LUNGS: Normal breath sounds bilaterally, no wheezing, rales, rhonchi. No use of accessory muscles of respiration.  CARDIOVASCULAR: S1, S2 normal. No murmurs, rubs, or gallops.  ABDOMEN: Soft, tenderness diffusely more in the left upper quadrant area. Bowel sounds normal. EXTREMITIES: No cyanosis, clubbing or edema b/l.    NEUROLOGIC: Cranial nerves II through XII are intact. No focal Motor or sensory deficits b/l.   PSYCHIATRIC: The patient is alert and oriented x 3.  SKIN: No obvious rash, lesion, or ulcer.    LABORATORY PANEL:   CBC  Recent Labs Lab 08/19/15 0837  WBC 14.1*  HGB 14.4  HCT 41.9  PLT 191   ------------------------------------------------------------------------------------------------------------------  Chemistries   Recent Labs Lab 08/18/15 2146  NA 140  K 3.2*  CL 104  CO2 27  GLUCOSE 138*  BUN 17  CREATININE 0.88  CALCIUM 9.3  AST 28  ALT 23  ALKPHOS 37*  BILITOT 1.2   ------------------------------------------------------------------------------------------------------------------  Cardiac Enzymes No results for input(s): TROPONINI in the last 168 hours. ------------------------------------------------------------------------------------------------------------------  RADIOLOGY:  No results found.   ASSESSMENT AND PLAN:   This is a 29 year old Hispanic male admitted for alcohol induced pancreatitis.  1. Acute alcoholic Pancreatitis:  Clear liquids. Pain control. Nausea medication. IV fluids. Lipase is slowly trending down. Ultrasound showed no gallstones or significant pancreatic changes. Repeat lipase in the morning. Counseled to quit alcohol. Mild leukocytosis likely reactive afebrile. No signs of infection..  2. Hypokalemia:  Replete potassium  3. Alcohol abuse: Initiate CIWA scale  4. DVT prophylaxis: Lovenox  All the records are reviewed and case discussed with Care Management/Social Workerr. Management plans discussed with the patient, family and they are in agreement.  CODE STATUS: Full code  DVT Prophylaxis: SCDs  TOTAL TIME TAKING CARE OF THIS PATIENT: 35 minutes.   POSSIBLE D/C IN 1-2 DAYS, DEPENDING ON CLINICAL CONDITION.   Milagros LollSudini, Avaley Coop R M.D on 08/19/2015 at 9:56 AM  Between 7am to 6pm - Pager - [phone]  After 6pm go to [email or web address] - password [email or web address]  [place]  [facility]  Office  [phone]  CC: Primary care physician; No PCP Per Patient    Note: This dictation was prepared with Dragon dictation along with smaller phrase technology. Any transcriptional errors that result from this process are unintentional.

## 2015-08-19 NOTE — H&P (Signed)
Larry Farmer is an 29 y.o. male.   Chief Complaint: Abdominal pain HPI: The patient presents emergency department complaining of abdominal pain. He says that it is 10 out of 10 in severity and has progressively worsened over the course of the day. The patient has had episodes of all and his pancreatitis before. He admits to drinking up until last night. He denies vomiting but admits to sporadic nausea. He also denies chest pain, shortness of breath, diarrhea or fever. In the emergency department laboratory evaluation revealed significantly elevated lipase which prompted the emergency [place] staff to call for admission.  Past Medical History  Diagnosis Date  . Patient denies medical problems     Past Surgical History  Procedure Laterality Date  . No past surgeries      Family History  Problem Relation Age of Onset  . Family history unknown: Yes   Social History:  reports that he has never smoked. He has never used smokeless tobacco. He reports that he drinks about 2.4 oz of alcohol per week. He reports that he does not use illicit drugs.  Allergies: No Known Allergies  Medications Prior to Admission  Medication Sig Dispense Refill  . ibuprofen (ADVIL) 200 MG tablet Take 1 tablet (200 mg total) by mouth every 6 (six) hours as needed. 15 tablet 0    Results for orders placed or performed during the hospital encounter of 08/18/15 (from the past 48 hour(s))  Lipase, blood     Status: Abnormal   Collection Time: 08/18/15  9:46 PM  Result Value Ref Range   Lipase 992 (H) 11 - 51 U/L    Comment: Please note change in reference range.  Comprehensive metabolic panel     Status: Abnormal   Collection Time: 08/18/15  9:46 PM  Result Value Ref Range   Sodium 140 135 - 145 mmol/L   Potassium 3.2 (L) 3.5 - 5.1 mmol/L   Chloride 104 101 - 111 mmol/L   CO2 27 22 - 32 mmol/L   Glucose, Bld 138 (H) 65 - 99 mg/dL   BUN 17 6 - 20 mg/dL   Creatinine, Ser 0.88 0.61 - 1.24 mg/dL   Calcium  9.3 8.9 - 10.3 mg/dL   Total Protein 7.5 6.5 - 8.1 g/dL   Albumin 4.5 3.5 - 5.0 g/dL   AST 28 15 - 41 U/L   ALT 23 17 - 63 U/L   Alkaline Phosphatase 37 (L) 38 - 126 U/L   Total Bilirubin 1.2 0.3 - 1.2 mg/dL   GFR calc non Af Amer >60 >60 mL/min   GFR calc Af Amer >60 >60 mL/min    Comment: (NOTE) The eGFR has been calculated using the CKD EPI equation. This calculation has not been validated in all clinical situations. eGFR's persistently <60 mL/min signify possible Chronic Kidney Disease.    Anion gap 9 5 - 15  CBC     Status: Abnormal   Collection Time: 08/18/15  9:46 PM  Result Value Ref Range   WBC 15.7 (H) 3.8 - 10.6 K/uL   RBC 5.15 4.40 - 5.90 MIL/uL   Hemoglobin 15.6 13.0 - 18.0 g/dL   HCT 45.8 40.0 - 52.0 %   MCV 88.9 80.0 - 100.0 fL   MCH 30.4 26.0 - 34.0 pg   MCHC 34.1 32.0 - 36.0 g/dL   RDW 12.8 11.5 - 14.5 %   Platelets 214 150 - 440 K/uL   No results found.  Review of Systems  Constitutional:  Negative for fever and chills.  HENT: Negative for sore throat and tinnitus.   Eyes: Negative for blurred vision and redness.  Respiratory: Negative for cough and shortness of breath.   Cardiovascular: Negative for chest pain, palpitations, orthopnea and PND.  Gastrointestinal: Positive for abdominal pain. Negative for nausea, vomiting and diarrhea.  Genitourinary: Negative for dysuria, urgency and frequency.  Musculoskeletal: Negative for myalgias and joint pain.  Skin: Negative for rash.       No lesions  Neurological: Negative for speech change, focal weakness and weakness.  Endo/Heme/Allergies: Does not bruise/bleed easily.       No temperature intolerance  Psychiatric/Behavioral: Negative for depression and suicidal ideas.    Blood pressure 155/91, pulse 72, temperature 98.6 F (37 C), temperature source Oral, resp. rate 18, height 5' 7"  (1.702 m), weight 61.281 kg (135 lb 1.6 oz), SpO2 100 %. Physical Exam  Nursing note and vitals reviewed. Constitutional:  He is oriented to person, place, and time. He appears well-developed and well-nourished. He appears distressed (In pain).  HENT:  Head: Normocephalic and atraumatic.  Mouth/Throat: Oropharynx is clear and moist.  Eyes: Conjunctivae and EOM are normal. Pupils are equal, round, and reactive to light. No scleral icterus.  Neck: Normal range of motion. Neck supple. No JVD present. No tracheal deviation present. No thyromegaly present.  Cardiovascular: Normal rate, regular rhythm and normal heart sounds.  Exam reveals no gallop and no friction rub.   No murmur heard. Respiratory: Effort normal and breath sounds normal. No respiratory distress.  GI: Bowel sounds are normal. He exhibits no distension and no mass. There is tenderness (Mid epigastric). There is guarding (Voluntary). There is no rebound.  Genitourinary:  Deferred  Musculoskeletal: Normal range of motion. He exhibits no edema or tenderness.  Lymphadenopathy:    He has no cervical adenopathy.  Neurological: He is alert and oriented to person, place, and time. No cranial nerve deficit.  Skin: Skin is warm and dry. No rash noted. No erythema.  Psychiatric: He has a normal mood and affect. His behavior is normal. Judgment and thought content normal.     Assessment/Plan This is a 29 year old Hispanic male admitted for all induced pancreatitis. 1. Pancreatitis: Secondary to alcohol abuse. Goal is pain management and aggressive IV hydration. I made the patient nothing by mouth. He is afebrile.  Renal function and calcium is normal. The patient has an elevated white blood cell count but vital signs are stable. No signs or symptoms of sepsis. No imaging necessary at this time. 2. Hypokalemia: Replete potassium 3. Alcohol abuse: Initiate CIWA scale 4. DVT prophylaxis: Heparin 5. GI prophylaxis: None The patient is a full code. Time spent on admission orders and patient care approximately 35 minutes  Harrie Foreman 08/19/2015, 2:12  AM

## 2015-08-19 NOTE — Plan of Care (Signed)
Problem: Discharge Progression Outcomes Goal: Other Discharge Outcomes/Goals Outcome: Progressing 1.Pain: Pt had Dilaudid and Morphine in the ED for LUQ abdominal pain.  2. Hemodynamics: 2 liter bolus given since admission. Last one infusing during transfer to this unit. Maintenance fluids currently infusing at IVF at 125. Vital signs stable. Urine specimen still pending  3. Diet: Currently NPO. Explanation given by interpreter whom was present during admission. 4. Activity: Pt lying still, said that the pain worsens when he moves about.  5. Pt on CIWA protocol.

## 2015-08-20 LAB — CBC WITH DIFFERENTIAL/PLATELET
Basophils Absolute: 0 10*3/uL (ref 0–0.1)
Basophils Relative: 0 %
Eosinophils Absolute: 0 10*3/uL (ref 0–0.7)
Eosinophils Relative: 0 %
HEMATOCRIT: 42.7 % (ref 40.0–52.0)
HEMOGLOBIN: 14.4 g/dL (ref 13.0–18.0)
LYMPHS ABS: 1.1 10*3/uL (ref 1.0–3.6)
LYMPHS PCT: 7 %
MCH: 30.1 pg (ref 26.0–34.0)
MCHC: 33.8 g/dL (ref 32.0–36.0)
MCV: 89.2 fL (ref 80.0–100.0)
Monocytes Absolute: 0.9 10*3/uL (ref 0.2–1.0)
Monocytes Relative: 6 %
NEUTROS PCT: 87 %
Neutro Abs: 13.9 10*3/uL — ABNORMAL HIGH (ref 1.4–6.5)
Platelets: 189 10*3/uL (ref 150–440)
RBC: 4.79 MIL/uL (ref 4.40–5.90)
RDW: 13.1 % (ref 11.5–14.5)
WBC: 16 10*3/uL — AB (ref 3.8–10.6)

## 2015-08-20 LAB — BASIC METABOLIC PANEL
Anion gap: 5 (ref 5–15)
BUN: 6 mg/dL (ref 6–20)
CHLORIDE: 106 mmol/L (ref 101–111)
CO2: 26 mmol/L (ref 22–32)
Calcium: 9 mg/dL (ref 8.9–10.3)
Creatinine, Ser: 0.64 mg/dL (ref 0.61–1.24)
GFR calc Af Amer: >60 mL/min (ref >60)
GFR calc non Af Amer: >60 mL/min (ref >60)
GLUCOSE: 122 mg/dL — AB (ref 65–99)
POTASSIUM: 3.9 mmol/L (ref 3.5–5.1)
Sodium: 137 mmol/L (ref 135–145)

## 2015-08-20 LAB — LIPASE, BLOOD: LIPASE: 296 U/L — AB (ref 11–51)

## 2015-08-20 MED ORDER — PANCRELIPASE (LIP-PROT-AMYL) 12000-38000 UNITS PO CPEP
12000.0000 [IU] | ORAL_CAPSULE | Freq: Three times a day (TID) | ORAL | Status: DC
  Administered 2015-08-20 – 2015-08-21 (×4): 12000 [IU] via ORAL
  Filled 2015-08-20 (×4): qty 1

## 2015-08-20 MED ORDER — OXYCODONE-ACETAMINOPHEN 7.5-325 MG PO TABS
1.0000 | ORAL_TABLET | Freq: Four times a day (QID) | ORAL | Status: DC | PRN
  Administered 2015-08-20 – 2015-08-21 (×3): 1 via ORAL
  Filled 2015-08-20 (×4): qty 1

## 2015-08-20 MED ORDER — ACETAMINOPHEN 325 MG PO TABS
650.0000 mg | ORAL_TABLET | Freq: Four times a day (QID) | ORAL | Status: DC | PRN
  Administered 2015-08-20: 13:00:00 650 mg via ORAL
  Filled 2015-08-20: qty 2

## 2015-08-20 NOTE — Plan of Care (Signed)
Problem: Discharge Progression Outcomes Goal: Other Discharge Outcomes/Goals Outcome: Progressing Plan of care progress: -IV fluids continue -advanced to full liquid diet tolerates with no nausea or vomiting noted -continues to complain of abdominal pain PRN pain meds given with improvement -creon started today -exitcare spanish information given to pt about pancreatitis and diet -temp increase to 100.3 PRN tylenol given with improvement

## 2015-08-20 NOTE — Care Management (Signed)
Patient presents from home with Pancreatitis.  Patient is a self pay patient.  Patient uses [facility] pharmacy on [address] to obtain his medication.  Patient was started on Creon while in the hospital.  [facility] help [facility] of [facility].  Annice PihJackie from interpreter services to come assist patient in completeing packet for [facility]

## 2015-08-20 NOTE — Plan of Care (Signed)
Problem: Discharge Progression Outcomes Goal: Other Discharge Outcomes/Goals Outcome: Progressing Pt with continued complaints of abdominal pain. Percocet given x2 with relief noted. No need to medicate per CIWA scale. Possible discharge today if labs improve.

## 2015-08-20 NOTE — Progress Notes (Signed)
[facility] - [place] at [facility]   PATIENT NAME: Larry Farmer    MR#:  [id number]  DATE OF BIRTH:  Jul 14, 1986  SUBJECTIVE:  CHIEF COMPLAINT:   Chief Complaint  Patient presents with  . Abdominal Pain  . Emesis   Patient admitted for abdominal pain and nausea. Found to have pancreatitis with lipase elevated at 900. His pain is 4 out of 10, tolerating clear liquid diet, has minimal pain in the left lower quadrant, lipase is coming down. History obtained through interpreter. Plan was discussed and all questions answered. REVIEW OF SYSTEMS:    Review of Systems  Constitutional: Negative for fever, chills and weight loss.  HENT: Negative for hearing loss and nosebleeds.   Eyes: Negative for blurred vision, double vision and pain.  Respiratory: Negative for cough, hemoptysis, sputum production, shortness of breath and wheezing.   Cardiovascular: Negative for chest pain, palpitations, orthopnea and leg swelling.  Gastrointestinal: Positive for abdominal pain (LLQ). Negative for heartburn, nausea, vomiting, diarrhea and constipation.  Genitourinary: Negative for dysuria and hematuria.  Musculoskeletal: Negative for myalgias, back pain and falls.  Skin: Negative for rash.  Neurological: Negative for dizziness, tremors, sensory change, speech change, focal weakness, seizures, weakness and headaches.  Endo/Heme/Allergies: Does not bruise/bleed easily.  Psychiatric/Behavioral: Negative for depression and memory loss. The patient is not nervous/anxious.    DRUG ALLERGIES:  No Known Allergies VITALS:  Blood pressure 140/85, pulse 65, temperature 99 F (37.2 C), temperature source Oral, resp. rate 18, height  (1.702 m), weight 63.821 kg (140 lb 11.2 oz), SpO2 100 %. PHYSICAL EXAMINATION:   Physical Exam  GENERAL:  29 y.o.-year-old patient lying in the bed with no acute distress.  EYES: Pupils equal, round, reactive to light and accommodation. No  scleral icterus. Extraocular muscles intact.  HEENT: Head atraumatic, normocephalic. Oropharynx and nasopharynx clear.  NECK:  Supple, no jugular venous distention. No thyroid enlargement, no tenderness.  LUNGS: Normal breath sounds bilaterally, no wheezing, rales, rhonchi. No use of accessory muscles of respiration.  CARDIOVASCULAR: S1, S2 normal. No murmurs, rubs, or gallops.  ABDOMEN: Soft, tenderness diffusely more in the left upper quadrant area. Bowel sounds normal. EXTREMITIES: No cyanosis, clubbing or edema b/l.    NEUROLOGIC: Cranial nerves II through XII are intact. No focal Motor or sensory deficits b/l.   PSYCHIATRIC: The patient is alert and oriented x 3.  SKIN: No obvious rash, lesion, or ulcer.  LABORATORY PANEL:   CBC  Recent Labs Lab 08/19/15 0837  WBC 14.1*  HGB 14.4  HCT 41.9  PLT 191   ------------------------------------------------------------------------------------------------------------------  Chemistries   Recent Labs Lab 08/18/15 2146  NA 140  K 3.2*  CL 104  CO2 27  GLUCOSE 138*  BUN 17  CREATININE 0.88  CALCIUM 9.3  AST 28  ALT 23  ALKPHOS 37*  BILITOT 1.2   ASSESSMENT AND PLAN:   This is a 29 year old Hispanic male admitted for alcohol induced pancreatitis.  1. Acute alcoholic Pancreatitis:  Clear liquids - will advance to Full liquids and advance to soft as tolerated Pain control. Nausea medication. - Stop morphine and cut back on his oral narcotics Cut back on IV fluids. Lipase is slowly trending down. Recheck now and in am Ultrasound showed no gallstones or significant pancreatic changes. Counseled to quit alcohol. Mild leukocytosis likely reactive afebrile. No signs of infection. Will start pancreatic enzymes.  2. Hypokalemia: Repleted and resolved  3. Alcohol abuse: Initiate CIWA scale  4. DVT  prophylaxis: Lovenox   All the records are reviewed and case discussed with Care Management/Social Worker. Management plans  discussed with the patient, family and they are in agreement.  CODE STATUS: Full code  DVT Prophylaxis: SCDs  TOTAL TIME TAKING CARE OF THIS PATIENT: 35 minutes.   POSSIBLE D/C IN AM, DEPENDING ON CLINICAL CONDITION.   [facility] Hospital[facility], Jenille Laszlo M.D on 08/20/2015 at 9:02 AM  Between 7am to 6pm - Pager - [phone]  After 6pm go to [email or web address] - password [email or web address]  [facility]  Office  [phone]  CC: Primary care physician; [facility]   Note: This dictation was prepared with Dragon dictation along with smaller phrase technology. Any transcriptional errors that result from this process are unintentional.

## 2015-08-20 NOTE — Progress Notes (Signed)
  CONCERNING: IV to Oral Route Change Policy  RECOMMENDATION: This patient is receiving thiamine by the intravenous route.  Based on criteria approved by the Pharmacy and Therapeutics Committee, the intravenous medication(s) is/are being converted to the equivalent oral dose form(s).   DESCRIPTION: These criteria include:  The patient is eating (either orally or via tube) and/or has been taking other orally administered medications for a least 24 hours  The patient has no evidence of active gastrointestinal bleeding or impaired GI absorption (gastrectomy, short bowel, patient on TNA or NPO).  If you have questions about this conversion, please contact the Pharmacy Department  []   [phone]( [phone] )  [facility] [x]   [phone]( [phone] )  [facility] []   [phone]( [phone] )  [facility] []   [phone]( [phone] )  [facility] []   [phone]( [phone] )  [facility]   DeBordieu ColonyScarpena,Chan Rosasco G, [facility] CenterRPH 08/20/2015 7:20 PM

## 2015-08-20 NOTE — Progress Notes (Signed)
Initial Nutrition Assessment   INTERVENTION:   Coordination of Care: await diet progression as medically able Education: RD provided "Pancreatitis Nutrition Therapy" handout from the Academy of Nutrition and Dietetics. Discussed nutrition therapy to reduce the irritation of the pancreas to promote digestion and absorption of nutrients. Provided list of recommended low fat foods as well as a recommended sample day. Also stressed the importance of general healthy nutrition as well as alcohol in moderation. Teach back method used. Expect good compliance.  NUTRITION DIAGNOSIS:   Food and nutrition related knowledge deficit related to acute illness as evidenced by per patient/family report.  GOAL:   Patient will meet greater than or equal to 90% of their needs  MONITOR:    (Energy Intake, Gastrointestinal Profile, Anthropometrics)  REASON FOR ASSESSMENT:   Diagnosis    ASSESSMENT:   Pt admitted with pancreatitis secondary EtOH use. Pt seen with interpretor, Maritza.  Past Medical History  Diagnosis Date  . Patient denies medical problems     Diet Order:  Diet full liquid Room service appropriate?: Yes; Fluid consistency:: Thin    Current Nutrition: Pt reports eating some lunch but not tolerating yogurt on tray and did not like soup.   Food/Nutrition-Related History: Pt reports appetite was very good PTA eating 3+ meals per day. Pt reports eating tacos, homemade foods with salsa for dinner. When asked pt also reports eating french fries, hamburgers, fried chicken at times as well.   Scheduled Medications:  . enoxaparin (LOVENOX) injection  40 mg Subcutaneous Q24H  . folic acid  1 mg Oral Daily  . lipase/protease/amylase  12,000 Units Oral TID AC  . LORazepam  0-4 mg Intravenous Q6H   Followed by  . [START ON 08/21/2015] LORazepam  0-4 mg Intravenous Q12H  . multivitamin with minerals  1 tablet Oral Daily  . sodium chloride  3 mL Intravenous Q12H  . thiamine  100 mg Oral  Daily   Or  . thiamine  100 mg Intravenous Daily    Continuous Medications:  . 0.9 % NaCl with KCl 40 mEq / L 75 mL/hr (08/20/15 0920)    Electrolyte/Renal Profile and Glucose Profile:   Recent Labs Lab 08/18/15 2146 08/20/15 0456  NA 140 137  K 3.2* 3.9  CL 104 106  CO2 27 26  BUN 17 6  CREATININE 0.88 0.64  CALCIUM 9.3 9.0  GLUCOSE 138* 122*   Protein Profile:  Recent Labs Lab 08/18/15 2146  ALBUMIN 4.5    Lipase     Component Value Date/Time   LIPASE 296* 08/20/2015 0456  Lipase 992 on admission per chart  Gastrointestinal Profile: Last BM:  08/18/2015   Weight Change: Pt reports stable weight PTA   Height:   Ht Readings from Last 1 Encounters:  08/19/15 5\' 7"  (1.702 m)    Weight:   Wt Readings from Last 1 Encounters:  08/20/15 140 lb 11.2 oz (63.821 kg)     BMI:  Body mass index is 22.03 kg/(m^2).   EDUCATION NEEDS:   Education needs addressed    LOW Care Level  Leda QuailAllyson Ane Conerly, IowaRD, LDN Pager [phone]

## 2015-08-21 LAB — LIPASE, BLOOD: Lipase: 81 U/L — ABNORMAL HIGH (ref 11–51)

## 2015-08-21 MED ORDER — PANCRELIPASE (LIP-PROT-AMYL) 12000-38000 UNITS PO CPEP: 12000.0000 [IU] | ORAL_CAPSULE | Freq: Three times a day (TID) | ORAL | Status: AC

## 2015-08-21 MED ORDER — ACETAMINOPHEN 325 MG PO TABS: 650.0000 mg | ORAL_TABLET | Freq: Four times a day (QID) | ORAL | Status: AC | PRN

## 2015-08-21 NOTE — Plan of Care (Signed)
Problem: Discharge Progression Outcomes Goal: Other Discharge Outcomes/Goals Plan of care progress to goal: - Complained of pain, percocet given with improvement. - Continues on IV fluids. - Ambulates in room. - Will continue to monitor.

## 2015-08-21 NOTE — Care Management (Signed)
[facility].  I informed Dr. Sherryll BurgerShah that patient would be able to receive up to 34 day supply of Creon [facility].  75% off coupon from [employer] printed for patient.  MATCH card with highlighted list of participating pharmacy as well as 75% off coupon attached to card.  RN Lance BoschShea to review with interpreter at time of discharge.  Per Orson SlickJacqui from interpreter services stated she would help the patient completed the [facility].  Orson SlickJacqui spoke with [facility] which did not carry Creon on formulary.  Message left for Jacqui to get update on [facility] update

## 2015-08-21 NOTE — Progress Notes (Signed)
Patient discharged to home via wheelchair by nursing staff. Mynor Witkop S, RN  

## 2015-08-21 NOTE — Discharge Instructions (Signed)

## 2015-08-21 NOTE — Discharge Summary (Signed)
[facility] - [place] at [facility]   PATIENT NAME: Larry Farmer    MR#:  [id number]  DATE OF BIRTH:  [birth year]  DATE OF ADMISSION:  08/18/2015 ADMITTING PHYSICIAN: Larry Natal, MD  DATE OF DISCHARGE: 08/21/2015 12:45 PM  PRIMARY CARE PHYSICIAN: [facility]   ADMISSION DIAGNOSIS:  Epigastric pain [R10.13] Acute pancreatitis, unspecified pancreatitis type [K85.9] Non-intractable vomiting with nausea, vomiting of unspecified type [R11.2]  DISCHARGE DIAGNOSIS:  Active Problems:   Pancreatitis  SECONDARY DIAGNOSIS:   Past Medical History  Diagnosis Date  . Patient denies medical problems    HOSPITAL COURSE:  29 year old Hispanic male admitted for alcohol induced pancreatitis.  It is his second admission, the first admission was in September when gallstone was ruled out.  Please see Dr. Elias Farmer dictated history and physical for further details.  Patient was slowly improving with aggressive IV hydration slowly advancing diet and pain controlling medication.  His lipase was slowly getting better and was close to his baseline by 25th of October.  He was able to tolerate diet without any increase in the pain.  Was not requiring any narcotic pain medication.  He was explained his long-term prognosis and slowly advancing the diet, even as an outpatient with the help of interpreter.  He is agreeable with the discharge plan and is being discharged home in stable condition. DISCHARGE CONDITIONS:  stable CONSULTS OBTAINED:    DRUG ALLERGIES:  No Known Allergies DISCHARGE MEDICATIONS:   Discharge Medication List as of 08/21/2015 12:06 PM    START taking these medications   Details  acetaminophen (TYLENOL) 325 MG tablet Take 2 tablets (650 mg total) by mouth every 6 (six) hours as needed for mild pain or fever., Starting 08/21/2015, Until Discontinued, Normal    lipase/protease/amylase (CREON) 12000 UNITS CPEP capsule Take 1 capsule (12,000  Units total) by mouth 3 (three) times daily before meals., Starting 08/21/2015, Until Discontinued, Print      CONTINUE these medications which have NOT CHANGED   Details  ibuprofen (ADVIL) 200 MG tablet Take 1 tablet (200 mg total) by mouth every 6 (six) hours as needed., Starting 07/01/2015, Until Discontinued, Normal       DISCHARGE INSTRUCTIONS:   DIET:  Regular diet - advance soft diet very slowly as tolerated DISCHARGE CONDITION:  Good ACTIVITY:  Activity as tolerated OXYGEN:  Home Oxygen: No.  Oxygen Delivery: room air DISCHARGE LOCATION:  home   If you experience worsening of your admission symptoms, develop shortness of breath, life threatening emergency, suicidal or homicidal thoughts you must seek medical attention immediately by calling 911 or calling your MD immediately  if symptoms less severe.  You Must read complete instructions/literature along with all the possible adverse reactions/side effects for all the Medicines you take and that have been prescribed to you. Take any new Medicines after you have completely understood and accpet all the possible adverse reactions/side effects.   Please note  You were cared for by a hospitalist during your hospital stay. If you have any questions about your discharge medications or the care you received while you were in the hospital after you are discharged, you can call the unit and asked to speak with the hospitalist on call if the hospitalist that took care of you is not available. Once you are discharged, your primary care physician will handle any further medical issues. Please note that NO REFILLS for any discharge medications will be authorized once you are discharged, as it is  imperative that you return to your primary care physician (or establish a relationship with a primary care physician if you do not have one) for your aftercare needs so that they can reassess your need for medications and monitor your lab  values.    On the day of Discharge: VITAL SIGNS:  Blood pressure 135/85, pulse 72, temperature 99 F (37.2 C), temperature source Oral, resp. rate 20, height 5\' 7"  (1.702 m), weight 62.256 kg (137 lb 4 oz), SpO2 100 %. PHYSICAL EXAMINATION:  GENERAL:  29 y.o.-year-old patient lying in the bed with no acute distress.  EYES: Pupils equal, round, reactive to light and accommodation. No scleral icterus. Extraocular muscles intact.  HEENT: Head atraumatic, normocephalic. Oropharynx and nasopharynx clear.  NECK:  Supple, no jugular venous distention. No thyroid enlargement, no tenderness.  LUNGS: Normal breath sounds bilaterally, no wheezing, rales,rhonchi or crepitation. No use of accessory muscles of respiration.  CARDIOVASCULAR: S1, S2 normal. No murmurs, rubs, or gallops.  ABDOMEN: Soft, non-tender, non-distended. Bowel sounds present. No organomegaly or mass.  EXTREMITIES: No pedal edema, cyanosis, or clubbing.  NEUROLOGIC: Cranial nerves II through XII are intact. Muscle strength 5/5 in all extremities. Sensation intact. Gait not checked.  PSYCHIATRIC: The patient is alert and oriented x 3.  SKIN: No obvious rash, lesion, or ulcer.  DATA REVIEW:   CBC  Recent Labs Lab 08/20/15 0456  WBC 16.0*  HGB 14.4  HCT 42.7  PLT 189    Chemistries   Recent Labs Lab 08/18/15 2146 08/20/15 0456  NA 140 137  K 3.2* 3.9  CL 104 106  CO2 27 26  GLUCOSE 138* 122*  BUN 17 6  CREATININE 0.88 0.64  CALCIUM 9.3 9.0  AST 28  --   ALT 23  --   ALKPHOS 37*  --   BILITOT 1.2  --    Management plans discussed with the patient, family and they are in agreement.  CODE STATUS: Full Code  TOTAL TIME TAKING CARE OF THIS PATIENT: 55 minutes.    [facility], Larry Farmer M.D on 08/21/2015 at 5:01 PM  Between 7am to 6pm - Pager - [phone]  After 6pm go to [email or web address] - password [email or web address] [facility]  [facility] [place] [facility]  Office  [phone]  CC: Primary care physician; [facility]

## 2015-08-21 NOTE — Progress Notes (Signed)
Discharge instructions (spanish and english copy) given and went over with patient at bedside with interpreter present. All questions answered. Prescription given and information regarding the Match Program provided by Judeth CornfieldStephanie, RN given to patient. Patient to be discharged home. Awaiting transportation. Bo McclintockBrewer,Choua Ikner S, RN

## 2015-08-21 NOTE — Care Management (Signed)
[facility] has an [facility] appointment November 8th at 615.  Interpreter services calling to notify patient. RNCM signing off

## 2015-09-04 ENCOUNTER — Ambulatory Visit: Payer: Self-pay

## 2015-09-05 ENCOUNTER — Ambulatory Visit: Payer: Self-pay

## 2015-09-11 ENCOUNTER — Other Ambulatory Visit: Payer: Self-pay

## 2015-09-18 ENCOUNTER — Ambulatory Visit: Payer: Self-pay

## 2015-09-19 LAB — HEPATIC FUNCTION PANEL
ALT: 25 U/L (ref 10–40)
AST: 25 U/L (ref 14–40)
Alkaline Phosphatase: 54 U/L (ref 25–125)
BILIRUBIN, TOTAL: 0.8 mg/dL

## 2015-09-19 LAB — BASIC METABOLIC PANEL
BUN: 16 mg/dL (ref 4–21)
Creatinine: 0.7 mg/dL (ref 0.6–1.3)
GLUCOSE: 91 mg/dL
Potassium: 4.4 mmol/L (ref 3.4–5.3)
SODIUM: 138 mmol/L (ref 137–147)

## 2015-09-19 LAB — CBC AND DIFFERENTIAL
HEMATOCRIT: 44 % (ref 41–53)
Hemoglobin: 15 g/dL (ref 13.5–17.5)
NEUTROS ABS: 3 /uL
Platelets: 215 10*3/uL (ref 150–399)
WBC: 5.3 10*3/mL

## 2015-09-19 LAB — LIPID PANEL
Cholesterol: 154 mg/dL (ref 0–200)
HDL: 67 mg/dL (ref 35–70)
LDL CALC: 78 mg/dL
Triglycerides: 47 mg/dL (ref 40–160)

## 2015-09-19 LAB — HEMOGLOBIN A1C: Hemoglobin A1C: 5.3

## 2015-09-19 LAB — TSH: TSH: 3.4 u[IU]/mL (ref 0.41–5.90)

## 2016-01-22 ENCOUNTER — Ambulatory Visit: Payer: Self-pay | Admitting: Nurse Practitioner

## 2016-01-22 VITALS — BP 122/65 | HR 72 | Temp 98.0°F | Wt 133.2 lb

## 2016-01-22 DIAGNOSIS — K858 Other acute pancreatitis without necrosis or infection: Secondary | ICD-10-CM

## 2016-01-22 DIAGNOSIS — E782 Mixed hyperlipidemia: Secondary | ICD-10-CM

## 2016-01-22 DIAGNOSIS — R5383 Other fatigue: Secondary | ICD-10-CM

## 2016-01-22 NOTE — Progress Notes (Signed)
   Subjective:    Patient ID: Esiquio Boesen, male    DOB: [birth year], 30 y.o.   MRN: [id number]  HPI  Doing well since last visit, eating well, has gained weight, no c/o pain, N/V, or diarcrhea.  Advised pt not to drink alcohol if involved with his pancretitis  No ETOH intake.      Review of Systems  Constitutional: Negative.   Respiratory: Negative.   Cardiovascular: Negative.   Gastrointestinal: Negative.        Objective:   Physical Exam  Constitutional: He is oriented to person, place, and time.  Cardiovascular: Normal rate and regular rhythm.   Pulmonary/Chest: Breath sounds normal.  Abdominal: Soft. Bowel sounds are normal. He exhibits no distension. There is no tenderness. There is no rebound and no guarding.  Neurological: He is alert and oriented to person, place, and time.  Skin: Skin is warm and dry. No pallor.          Assessment & Plan:  Bertis was seen today for pancreatitis recheck.  Diagnoses and all orders for this visit:  Other acute pancreatitis -     Comp Met (CMET) -     Lipase -     Amylase  will order labs to assess general physiological status, no meds indicated at this time, will also do lipid profile and cbc, pt may return in 6-7 months for exam and flu shot  Return next November or October for exam and flu shot.

## 2016-01-23 LAB — CBC WITH DIFFERENTIAL/PLATELET
BASOS: 0 %
Basophils Absolute: 0 10*3/uL (ref 0.0–0.2)
EOS (ABSOLUTE): 0.1 10*3/uL (ref 0.0–0.4)
Eos: 1 %
Hematocrit: 45.3 % (ref 37.5–51.0)
Hemoglobin: 15.6 g/dL (ref 12.6–17.7)
IMMATURE GRANS (ABS): 0 10*3/uL (ref 0.0–0.1)
Immature Granulocytes: 0 %
LYMPHS ABS: 2 10*3/uL (ref 0.7–3.1)
Lymphs: 36 %
MCH: 30.3 pg (ref 26.6–33.0)
MCHC: 34.4 g/dL (ref 31.5–35.7)
MCV: 88 fL (ref 79–97)
Monocytes Absolute: 0.3 10*3/uL (ref 0.1–0.9)
Monocytes: 6 %
NEUTROS ABS: 3.2 10*3/uL (ref 1.4–7.0)
Neutrophils: 57 %
PLATELETS: 247 10*3/uL (ref 150–379)
RBC: 5.15 x10E6/uL (ref 4.14–5.80)
RDW: 13.4 % (ref 12.3–15.4)
WBC: 5.6 10*3/uL (ref 3.4–10.8)

## 2016-01-23 LAB — LIPASE: LIPASE: 46 U/L (ref 0–59)

## 2016-01-23 LAB — COMPREHENSIVE METABOLIC PANEL
ALBUMIN: 4.8 g/dL (ref 3.5–5.5)
ALK PHOS: 59 IU/L (ref 39–117)
ALT: 29 IU/L (ref 0–44)
AST: 26 IU/L (ref 0–40)
Albumin/Globulin Ratio: 1.7 (ref 1.2–2.2)
BUN/Creatinine Ratio: 27 — ABNORMAL HIGH (ref 8–19)
BUN: 17 mg/dL (ref 6–20)
Bilirubin Total: 1 mg/dL (ref 0.0–1.2)
CALCIUM: 9.9 mg/dL (ref 8.7–10.2)
CO2: 25 mmol/L (ref 18–29)
CREATININE: 0.64 mg/dL — AB (ref 0.76–1.27)
Chloride: 100 mmol/L (ref 96–106)
GFR calc Af Amer: 153 mL/min/{1.73_m2} (ref >59)
GFR, EST NON AFRICAN AMERICAN: 132 mL/min/{1.73_m2} (ref >59)
GLOBULIN, TOTAL: 2.8 g/dL (ref 1.5–4.5)
GLUCOSE: 97 mg/dL (ref 65–99)
Potassium: 4.3 mmol/L (ref 3.5–5.2)
SODIUM: 140 mmol/L (ref 134–144)
Total Protein: 7.6 g/dL (ref 6.0–8.5)

## 2016-01-23 LAB — AMYLASE: AMYLASE: 65 U/L (ref 31–124)

## 2016-01-23 LAB — LIPID PANEL
CHOLESTEROL TOTAL: 152 mg/dL (ref 100–199)
Chol/HDL Ratio: 2.2 ratio units (ref 0.0–5.0)
HDL: 68 mg/dL (ref >39)
LDL Calculated: 58 mg/dL (ref 0–99)
TRIGLYCERIDES: 131 mg/dL (ref 0–149)
VLDL CHOLESTEROL CAL: 26 mg/dL (ref 5–40)

## 2016-03-09 IMAGING — US US ABDOMEN COMPLETE
1 series · 14 of 25 positions shown · non-contrast
Comparison: None.

CLINICAL DATA: Diffuse abdominal pain. Elevated bilirubin and
lipase.

EXAM:
ULTRASOUND ABDOMEN COMPLETE

[Series 1: us abdomen complete · 0.17mm/px · 14 of 139 slices shown]
[im 1/139]
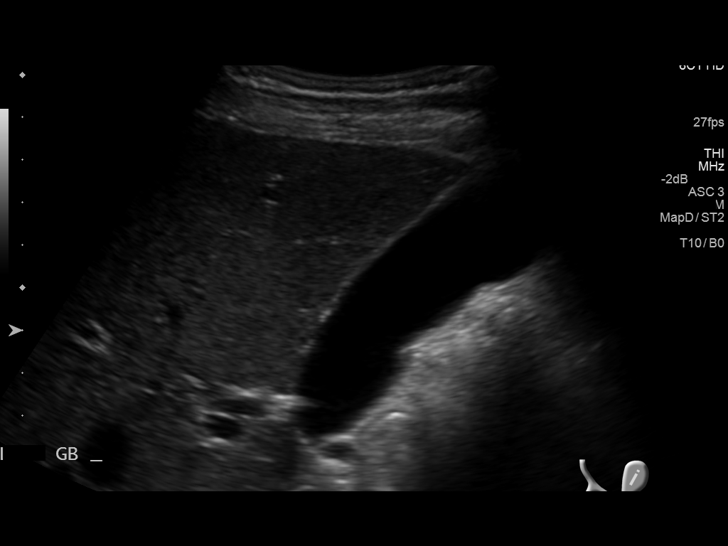
[im 12/139]
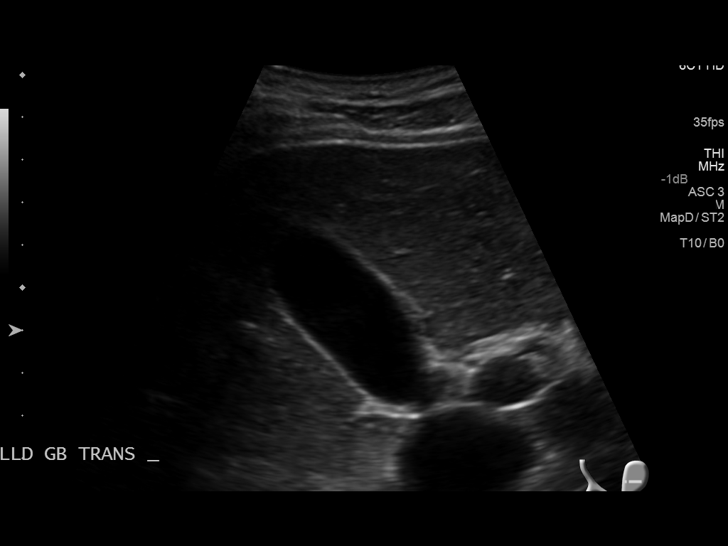
[im 24/139]
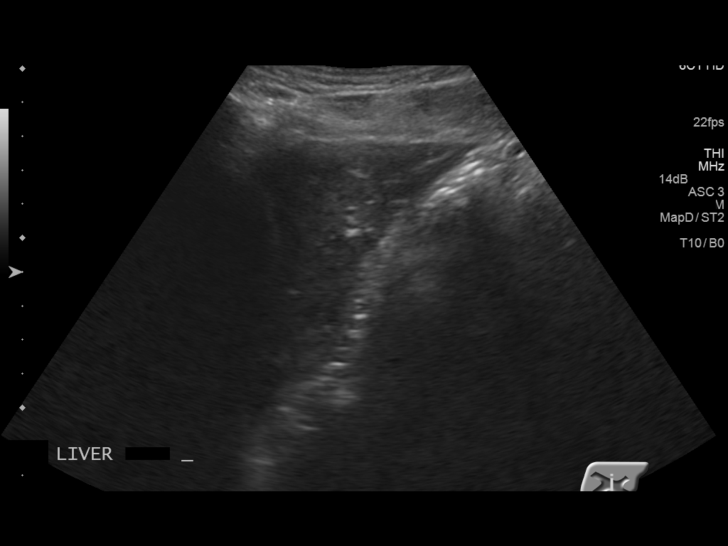
[im 35/139]
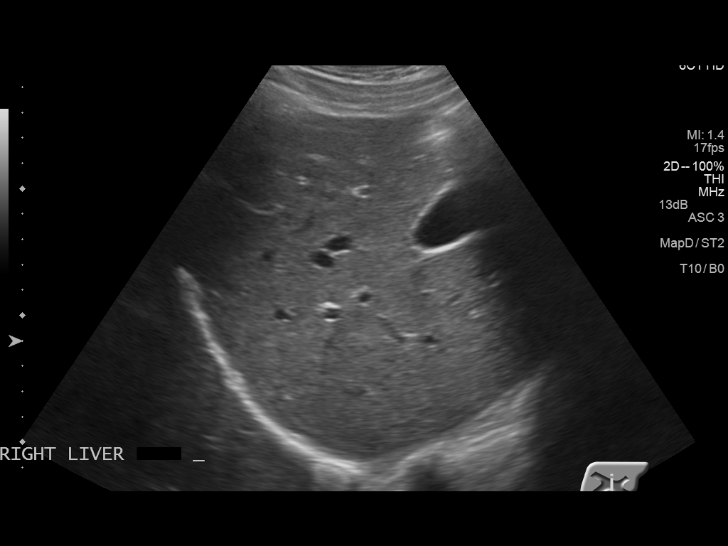
[im 47/139]
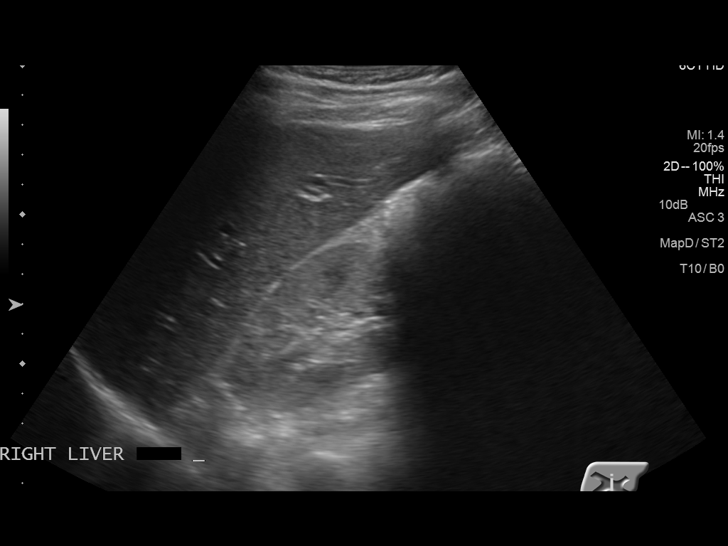
[im 52/139]
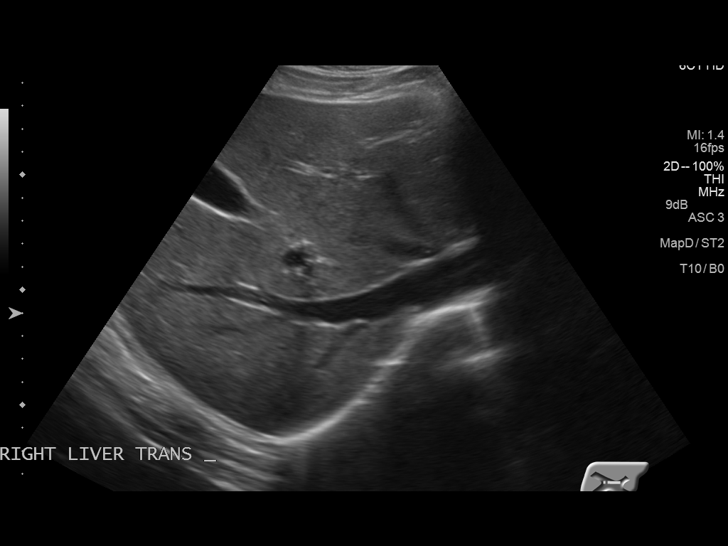
[im 64/139]
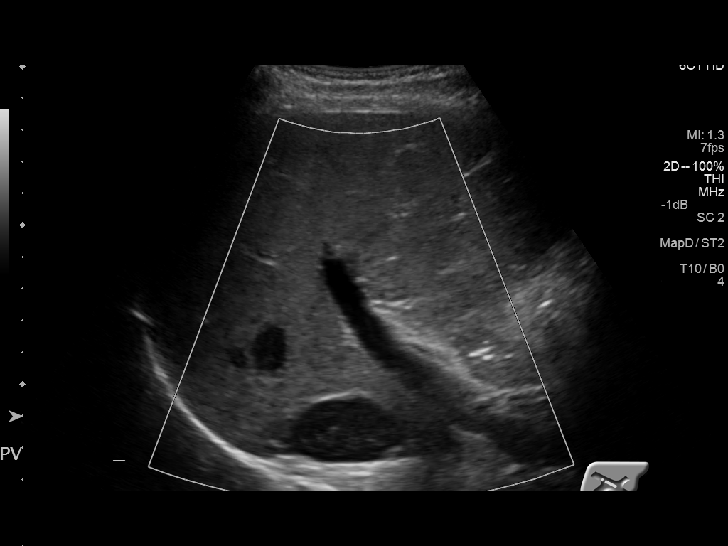
[im 75/139]
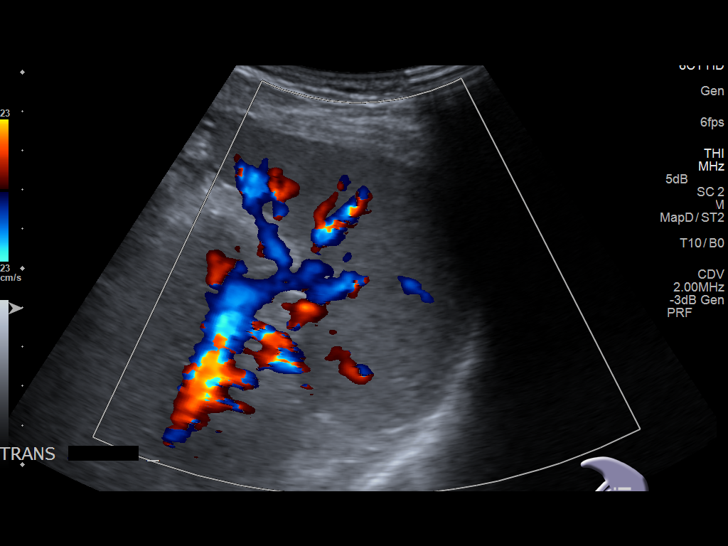
[im 87/139]
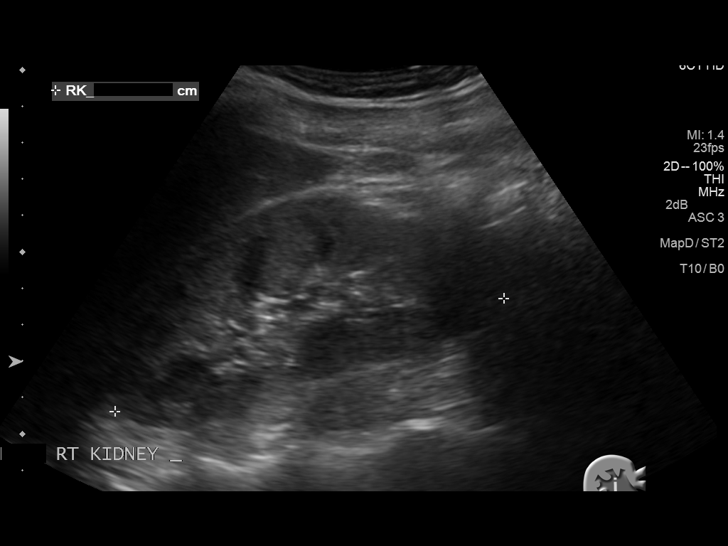
[im 93/139]
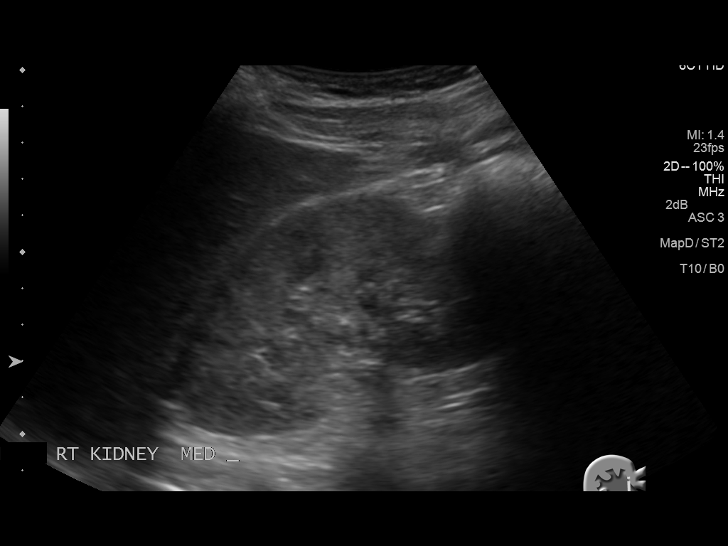
[im 104/139]
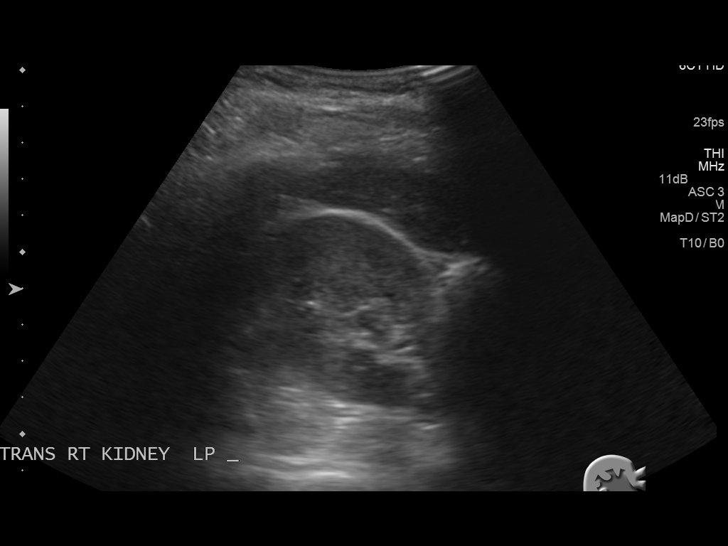
[im 116/139]
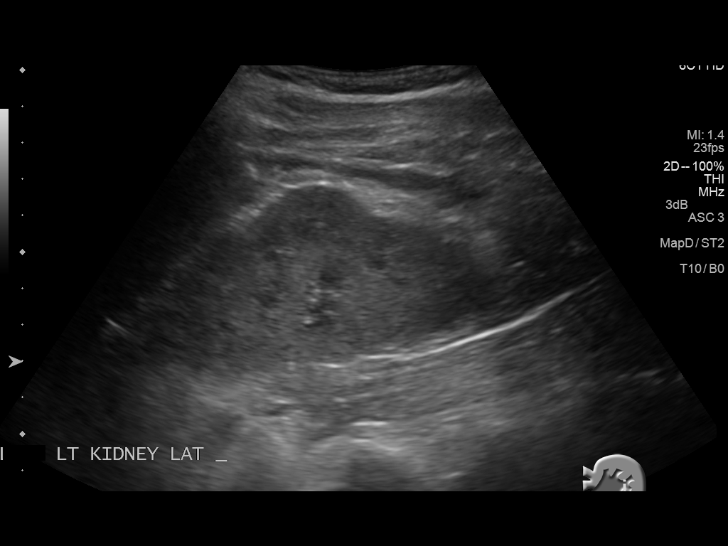
[im 127/139]
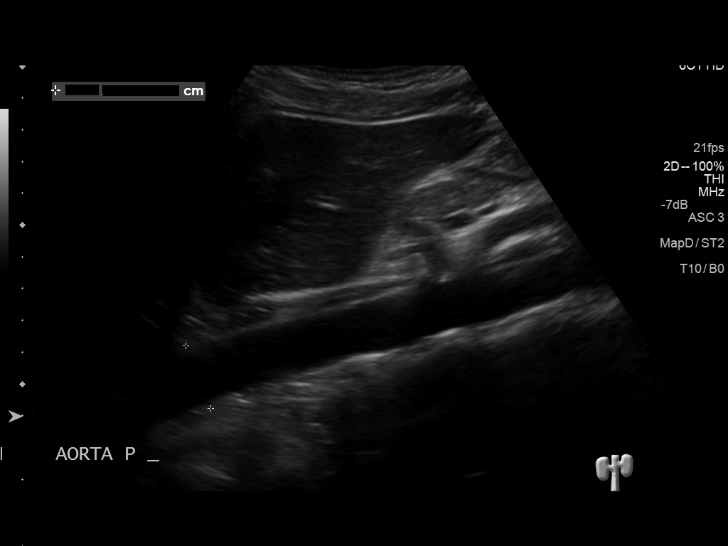
[im 139/139]
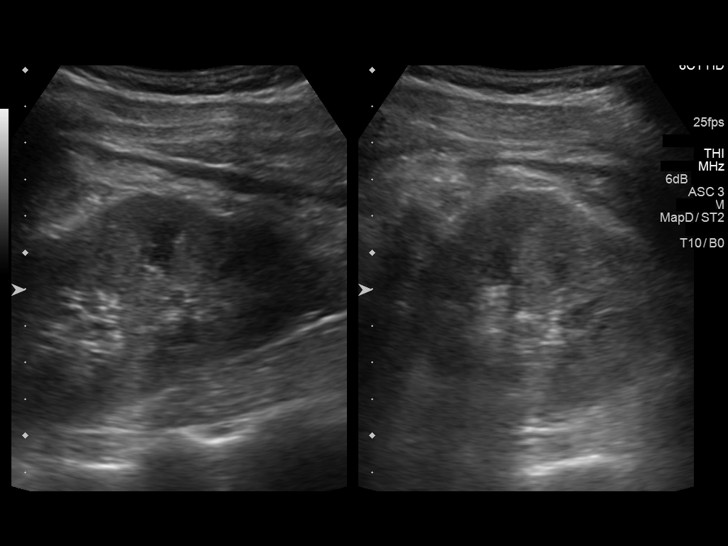

[14 of 25 positions shown; findings below may reference images not displayed]

FINDINGS: Gallbladder: Gallbladder has a normal appearance. Gallbladder wall
is 2.0 mm, within normal limits. No stones or pericholecystic fluid.
No sonographic Murphy's sign.

Common bile duct: Diameter: 1.8 mm

Liver: No focal lesion identified. Liver parenchyma appears mildly
hypoechoic with prominent portal triads. This finding can be seen in
diffuse hepatic edema and hepatitis.

IVC: No abnormality visualized.

Pancreas: Visualized portion unremarkable.

Spleen: Size and appearance within normal limits.

Right Kidney: Length: 11.1 cm. Normal renal echogenicity. No focal
mass or hydronephrosis.

Left Kidney: Length: 11.3 cm. Normal renal echogenicity. No focal
mass or hydronephrosis.

Abdominal aorta: No aneurysm visualized.

Other findings: None.
IMPRESSION: 1. Hypoechoic liver, a finding correlated with hepatitis.
2. No focal hepatic mass or evidence for biliary obstruction.
3. Normal appearance of the kidneys.
